# Patient Record
Sex: Male | Born: 1950 | Race: White | Hispanic: No | Marital: Married
Health system: Midwestern US, Academic
[De-identification: ages and names within clinical notes are randomized; demographics above are authoritative.]

---

## 2017-07-30 ENCOUNTER — Encounter: Admit: 2017-07-30 | Discharge: 2017-07-30 | Payer: MEDICARE

## 2017-07-30 DIAGNOSIS — I10 Essential (primary) hypertension: ICD-10-CM

## 2017-07-30 DIAGNOSIS — I2510 Atherosclerotic heart disease of native coronary artery without angina pectoris: ICD-10-CM

## 2017-07-30 DIAGNOSIS — E7800 Pure hypercholesterolemia, unspecified: Principal | ICD-10-CM

## 2017-08-02 ENCOUNTER — Encounter: Admit: 2017-08-02 | Discharge: 2017-08-02 | Payer: MEDICARE

## 2017-08-02 DIAGNOSIS — I10 Essential (primary) hypertension: ICD-10-CM

## 2017-08-02 DIAGNOSIS — I2510 Atherosclerotic heart disease of native coronary artery without angina pectoris: ICD-10-CM

## 2017-08-02 DIAGNOSIS — E7800 Pure hypercholesterolemia, unspecified: Principal | ICD-10-CM

## 2017-08-02 LAB — COMPREHENSIVE METABOLIC PANEL
Lab: 0.9
Lab: 6.9
Lab: 9.5
Lab: 103
Lab: 138
Lab: 17
Lab: 203 — ABNORMAL HIGH (ref 80–115)
Lab: 25
Lab: 3.7
Lab: 4

## 2017-08-02 LAB — LIPID PROFILE
Lab: 155
Lab: 63

## 2017-08-29 ENCOUNTER — Encounter: Admit: 2017-08-29 | Discharge: 2017-08-29 | Payer: MEDICARE

## 2017-08-29 ENCOUNTER — Ambulatory Visit: Admit: 2017-08-29 | Discharge: 2017-08-30 | Payer: MEDICARE

## 2017-08-29 DIAGNOSIS — I2510 Atherosclerotic heart disease of native coronary artery without angina pectoris: ICD-10-CM

## 2017-08-29 DIAGNOSIS — E785 Hyperlipidemia, unspecified: ICD-10-CM

## 2017-08-29 DIAGNOSIS — I10 Essential (primary) hypertension: ICD-10-CM

## 2017-08-29 DIAGNOSIS — I350 Nonrheumatic aortic (valve) stenosis: Principal | ICD-10-CM

## 2017-08-29 DIAGNOSIS — E119 Type 2 diabetes mellitus without complications: ICD-10-CM

## 2017-08-29 DIAGNOSIS — T148XXA Other injury of unspecified body region, initial encounter: ICD-10-CM

## 2017-08-29 NOTE — Assessment & Plan Note
Blood pressure looks good on the current medical program.

## 2017-08-29 NOTE — Assessment & Plan Note
His fasting glucose was about 200 and he has been having a lot of nocturia.  I strongly suggested that he make an appointment to get in to see Dr. Nyoka Cowden.

## 2017-08-29 NOTE — Assessment & Plan Note
Moderate aortic stenosis by echocardiography about a year ago.  No symptoms to suggest that he is progressed significantly.  I will plan an echocardiogram when I see him back in 1 year.

## 2017-08-29 NOTE — Progress Notes
Date of Service: 08/29/2017    Darius Cherry is a 66 y.o. male.       HPI     Darius Cherry was in the [place] office today for follow-up regarding coronary disease and aortic stenosis.  We reviewed the results of his recent lab work which included a fasting glucose level of about 200.  He has been having a lot of nocturia.  He has not seen Dr. Chilton Si for over a year.    He denies any problems with exertional chest discomfort.  He has a little bit of exertional breathlessness but the pattern is quite stable.  He denies any exertional lightheadedness.  He has had no palpitations or syncope.             Vitals:    08/29/17 1026 08/29/17 1034   BP: 116/76 120/74   Pulse: 66    Weight: 97.7 kg (215 lb 6.4 oz)    Height: 1.803 m (5' 11)      Body mass index is 30.04 kg/m???.     Past Medical History  Patient Active Problem List    Diagnosis Date Noted   ??? Left ankle pain 10/08/2016     Added automatically from request for surgery [id number]     ??? 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   ??? Essential hypertension 05/12/2015   ??? Nonrheumatic aortic valve stenosis 05/12/2015   ??? Type 2 diabetes mellitus without complication, without long-term current use of insulin (HCC) 05/12/2015   ??? Overweight (BMI 25.0-29.9) 11/11/2014   ??? Hyperlipemia 04/26/2009   ??? CAD (coronary artery disease) 04/26/2009     10/22/05: Per Dr. Ludwig Lean: Successful percutaneous coronary intervention of the circumflex obtuse marginal bifurcation with a 3.0 x 23 mm Vision bare metal stent extending from the circumflex into the obtuse marginal and a 3.5 x 12 mm Vision bare metal stent in the circumflex at the bifurcation utilizing a Kulot stent technique.     05/20/12: Drug-eluting stent PCI to prox CFX and LAD by Dr. Bufford Buttner           Review of Systems   Constitution: Negative.   HENT: Negative.    Eyes: Negative.    Cardiovascular: Negative.    Respiratory: Negative.    Endocrine: Negative.    Hematologic/Lymphatic: Negative.    Skin: Negative. Musculoskeletal: Negative.    Gastrointestinal: Negative.    Genitourinary: Negative.    Neurological: Negative.    Psychiatric/Behavioral: Negative.    Allergic/Immunologic: Negative.        Physical Exam    Physical Exam   General Appearance: no distress   Skin: warm, no ulcers or xanthomas   Digits and Nails: no cyanosis or clubbing   Eyes: conjunctivae and lids normal, pupils are equal and round   Teeth/Gums/Palate: dentition unremarkable, no lesions   Lips & Oral Mucosa: no pallor or cyanosis   Neck Veins: normal JVP , neck veins are not distended   Thyroid: no nodules, masses, tenderness or enlargement   Chest Inspection: chest is normal in appearance   Respiratory Effort: breathing comfortably, no respiratory distress   Auscultation/Percussion: lungs clear to auscultation, no rales or rhonchi, no wheezing   PMI: PMI not enlarged or displaced   Cardiac Rhythm: regular rhythm and normal rate   Cardiac Auscultation: S1, S2 normal, no rub, no gallop   Murmurs: grade 2 systolic murmur   Peripheral Circulation: normal peripheral circulation   Carotid Arteries: normal carotid upstroke bilaterally, no bruits   Radial  Arteries: normal symmetric radial pulses   Abdominal Aorta: no abdominal aortic bruit   Pedal Pulses: normal symmetric pedal pulses   Lower Extremity Edema: no lower extremity edema   Abdominal Exam: soft, non-tender, no masses, bowel sounds normal   Liver & Spleen: no organomegaly   Gait & Station: walks without assistance   Muscle Strength: normal muscle tone   Orientation: oriented to time, place and person   Affect & Mood: appropriate and sustained affect   Language and Memory: patient responsive and seems to comprehend information   Neurologic Exam: neurological assessment grossly intact   Other: moves all extremities        Problems Addressed Today  Encounter Diagnoses   Name Primary?   ??? Nonrheumatic aortic valve stenosis    ??? Essential hypertension ??? Coronary artery disease involving native coronary artery of native heart without angina pectoris    ??? Type 2 diabetes mellitus without complication, without long-term current use of insulin (HCC)        Assessment and Plan       Nonrheumatic aortic valve stenosis  Moderate aortic stenosis by echocardiography about a year ago.  No symptoms to suggest that he is progressed significantly.  I will plan an echocardiogram when I see him back in 1 year.    Essential hypertension  Blood pressure looks good on the current medical program.    CAD (coronary artery disease)  It has been 5 years since his coronary stent procedure.  I will schedule a stress test when I see him back in about a year.  He is not having any angina symptoms currently.    Type 2 diabetes mellitus without complication, without long-term current use of insulin (HCC)  His fasting glucose was about 200 and he has been having a lot of nocturia.  I strongly suggested that he make an appointment to get in to see Dr. Chilton Si.      Current Medications (including today's revisions)  ??? Aspirin 81 mg Tab Take 1 Tab by mouth daily.   ??? atorvastatin (LIPITOR) 40 mg tablet TAKE ONE TABLET BY MOUTH ONCE DAILY   ??? CALCIUM CITRATE/VITAMIN D3 (CALCIUM CITRATE + D PO) Take  by mouth daily.   ??? HYDROcodone/acetaminophen (NORCO) 5/325 mg tablet Take 1-2 tablets by mouth every 6 hours as needed for Pain Max 8 tabs/day   ??? metoprolol (LOPRESSOR) 25 mg tablet Take 0.5 Tabs by mouth twice daily. The dose is 12.5 mg (half tablet) po BID.   ??? nitroglycerin (NITROSTAT) 0.4 mg tablet Take one sublingual nitro Q 5 minX three for chest pain   ??? Sitagliptin-Metformin (JANUMET) 50-1,000 mg Tab Take 1 Tab by mouth twice daily with meals. DO NOT RESUME UNTIL 05/23/12 (Patient taking differently: Take 2 tablets by mouth twice daily with meals. DO NOT RESUME UNTIL 05/23/12)

## 2017-08-29 NOTE — Assessment & Plan Note
It has been 5 years since his coronary stent procedure.  I will schedule a stress test when I see him back in about a year.  He is not having any angina symptoms currently.

## 2017-09-26 ENCOUNTER — Encounter: Admit: 2017-09-26 | Discharge: 2017-09-26 | Payer: MEDICARE

## 2017-09-27 MED ORDER — ATORVASTATIN 40 MG PO TAB
ORAL_TABLET | Freq: Every day | 3 refills | Status: AC
Start: 2017-09-27 — End: 2018-11-25

## 2017-12-06 ENCOUNTER — Encounter: Admit: 2017-12-06 | Discharge: 2017-12-06 | Payer: MEDICARE

## 2017-12-06 DIAGNOSIS — M25572 Pain in left ankle and joints of left foot: Principal | ICD-10-CM

## 2017-12-10 ENCOUNTER — Ambulatory Visit: Admit: 2017-12-10 | Discharge: 2017-12-10 | Payer: MEDICARE

## 2017-12-10 ENCOUNTER — Encounter: Admit: 2017-12-10 | Discharge: 2017-12-11 | Payer: MEDICARE

## 2017-12-10 ENCOUNTER — Encounter: Admit: 2017-12-10 | Discharge: 2017-12-10 | Payer: MEDICARE

## 2017-12-10 DIAGNOSIS — I2510 Atherosclerotic heart disease of native coronary artery without angina pectoris: Principal | ICD-10-CM

## 2017-12-10 DIAGNOSIS — T148XXA Other injury of unspecified body region, initial encounter: ICD-10-CM

## 2017-12-10 DIAGNOSIS — M25472 Effusion, left ankle: ICD-10-CM

## 2017-12-10 DIAGNOSIS — M25572 Pain in left ankle and joints of left foot: Principal | ICD-10-CM

## 2017-12-10 DIAGNOSIS — E119 Type 2 diabetes mellitus without complications: ICD-10-CM

## 2017-12-10 DIAGNOSIS — I10 Essential (primary) hypertension: ICD-10-CM

## 2017-12-10 DIAGNOSIS — E785 Hyperlipidemia, unspecified: ICD-10-CM

## 2017-12-10 LAB — GRAM STAIN

## 2017-12-10 MED ORDER — AMOXICILLIN 500 MG PO TAB
ORAL_TABLET | ORAL | 1 refills | 7.00000 days | Status: AC
Start: 2017-12-10 — End: ?

## 2017-12-15 LAB — CULTURE-WOUND/TISSUE/FLUID(AEROBIC ONLY)W/SENSITIVITY

## 2017-12-16 LAB — CULTURE-ANAEROBIC

## 2017-12-18 ENCOUNTER — Encounter: Admit: 2017-12-18 | Discharge: 2017-12-18 | Payer: MEDICARE

## 2017-12-18 DIAGNOSIS — M25472 Effusion, left ankle: Principal | ICD-10-CM

## 2017-12-18 DIAGNOSIS — M25572 Pain in left ankle and joints of left foot: ICD-10-CM

## 2017-12-19 ENCOUNTER — Encounter: Admit: 2017-12-19 | Discharge: 2017-12-19 | Payer: MEDICARE

## 2017-12-19 DIAGNOSIS — T148XXA Other injury of unspecified body region, initial encounter: ICD-10-CM

## 2017-12-19 DIAGNOSIS — E119 Type 2 diabetes mellitus without complications: ICD-10-CM

## 2017-12-19 DIAGNOSIS — I2510 Atherosclerotic heart disease of native coronary artery without angina pectoris: ICD-10-CM

## 2017-12-19 DIAGNOSIS — E785 Hyperlipidemia, unspecified: ICD-10-CM

## 2017-12-19 DIAGNOSIS — I10 Essential (primary) hypertension: ICD-10-CM

## 2017-12-30 ENCOUNTER — Encounter: Admit: 2017-12-30 | Discharge: 2017-12-30 | Payer: MEDICARE

## 2018-01-13 LAB — CULTURE-FUNGAL,OTHER

## 2018-01-21 ENCOUNTER — Ambulatory Visit: Admit: 2018-01-21 | Discharge: 2018-01-21 | Payer: MEDICARE

## 2018-01-21 ENCOUNTER — Encounter: Admit: 2018-01-21 | Discharge: 2018-01-21 | Payer: MEDICARE

## 2018-01-21 DIAGNOSIS — E119 Type 2 diabetes mellitus without complications: ICD-10-CM

## 2018-01-21 DIAGNOSIS — I2510 Atherosclerotic heart disease of native coronary artery without angina pectoris: Principal | ICD-10-CM

## 2018-01-21 DIAGNOSIS — M25572 Pain in left ankle and joints of left foot: ICD-10-CM

## 2018-01-21 DIAGNOSIS — M25472 Effusion, left ankle: Principal | ICD-10-CM

## 2018-01-21 DIAGNOSIS — T148XXA Other injury of unspecified body region, initial encounter: ICD-10-CM

## 2018-01-21 DIAGNOSIS — G8929 Other chronic pain: ICD-10-CM

## 2018-01-21 DIAGNOSIS — I10 Essential (primary) hypertension: ICD-10-CM

## 2018-01-21 DIAGNOSIS — T84098A Other mechanical complication of other internal joint prosthesis, initial encounter: Principal | ICD-10-CM

## 2018-01-21 DIAGNOSIS — E785 Hyperlipidemia, unspecified: ICD-10-CM

## 2018-01-27 LAB — CULTURE-TB (AFB)

## 2018-01-30 ENCOUNTER — Encounter: Admit: 2018-01-30 | Discharge: 2018-01-30 | Payer: MEDICARE

## 2018-01-30 DIAGNOSIS — T84098A Other mechanical complication of other internal joint prosthesis, initial encounter: Principal | ICD-10-CM

## 2018-02-05 DIAGNOSIS — T84098A Other mechanical complication of other internal joint prosthesis, initial encounter: ICD-10-CM

## 2018-03-10 ENCOUNTER — Ambulatory Visit: Admit: 2018-03-10 | Discharge: 2018-03-11 | Payer: MEDICARE

## 2018-03-10 ENCOUNTER — Encounter: Admit: 2018-03-10 | Discharge: 2018-03-10 | Payer: MEDICARE

## 2018-03-10 ENCOUNTER — Inpatient Hospital Stay: Admit: 2018-03-10 | Discharge: 2018-03-10 | Payer: MEDICARE

## 2018-03-10 DIAGNOSIS — I10 Essential (primary) hypertension: ICD-10-CM

## 2018-03-10 DIAGNOSIS — T148XXA Other injury of unspecified body region, initial encounter: ICD-10-CM

## 2018-03-10 DIAGNOSIS — E119 Type 2 diabetes mellitus without complications: ICD-10-CM

## 2018-03-10 DIAGNOSIS — I350 Nonrheumatic aortic (valve) stenosis: ICD-10-CM

## 2018-03-10 DIAGNOSIS — I2510 Atherosclerotic heart disease of native coronary artery without angina pectoris: ICD-10-CM

## 2018-03-10 DIAGNOSIS — Z955 Presence of coronary angioplasty implant and graft: ICD-10-CM

## 2018-03-10 DIAGNOSIS — E785 Hyperlipidemia, unspecified: Principal | ICD-10-CM

## 2018-03-10 DIAGNOSIS — I219 Acute myocardial infarction, unspecified: ICD-10-CM

## 2018-03-10 DIAGNOSIS — R011 Cardiac murmur, unspecified: ICD-10-CM

## 2018-03-10 DIAGNOSIS — C4490 Unspecified malignant neoplasm of skin, unspecified: ICD-10-CM

## 2018-03-10 LAB — BASIC METABOLIC PANEL
Lab: 0.9 mg/dL (ref 0.4–1.24)
Lab: 103 MMOL/L (ref 98–110)
Lab: 137 MMOL/L (ref 137–147)
Lab: 201 mg/dL — ABNORMAL HIGH (ref 70–100)
Lab: 3.8 MMOL/L (ref 3.5–5.1)
Lab: 9 g/dL (ref 3–12)
Lab: >60 mL/min (ref >60)
Lab: >60 mL/min (ref >60)

## 2018-03-10 LAB — CBC
Lab: 4.5 M/UL (ref 4.4–5.5)
Lab: 5.6 10*3/uL (ref 4.5–11.0)

## 2018-03-11 DIAGNOSIS — I25119 Atherosclerotic heart disease of native coronary artery with unspecified angina pectoris: Principal | ICD-10-CM

## 2018-03-11 DIAGNOSIS — Z01818 Encounter for other preprocedural examination: ICD-10-CM

## 2018-03-28 ENCOUNTER — Encounter: Admit: 2018-03-28 | Discharge: 2018-03-29 | Payer: MEDICARE

## 2018-03-28 ENCOUNTER — Encounter: Admit: 2018-03-28 | Discharge: 2018-03-28 | Payer: MEDICARE

## 2018-03-28 ENCOUNTER — Inpatient Hospital Stay: Admit: 2018-03-28 | Discharge: 2018-03-30 | Disposition: A | Discharge status: Home / Self Care | Payer: MEDICARE

## 2018-03-28 DIAGNOSIS — E785 Hyperlipidemia, unspecified: Principal | ICD-10-CM

## 2018-03-28 DIAGNOSIS — I2510 Atherosclerotic heart disease of native coronary artery without angina pectoris: ICD-10-CM

## 2018-03-28 DIAGNOSIS — R011 Cardiac murmur, unspecified: ICD-10-CM

## 2018-03-28 DIAGNOSIS — Z955 Presence of coronary angioplasty implant and graft: ICD-10-CM

## 2018-03-28 DIAGNOSIS — T148XXA Other injury of unspecified body region, initial encounter: ICD-10-CM

## 2018-03-28 DIAGNOSIS — I10 Essential (primary) hypertension: ICD-10-CM

## 2018-03-28 DIAGNOSIS — C4490 Unspecified malignant neoplasm of skin, unspecified: ICD-10-CM

## 2018-03-28 DIAGNOSIS — I350 Nonrheumatic aortic (valve) stenosis: ICD-10-CM

## 2018-03-28 DIAGNOSIS — I219 Acute myocardial infarction, unspecified: ICD-10-CM

## 2018-03-28 DIAGNOSIS — E119 Type 2 diabetes mellitus without complications: ICD-10-CM

## 2018-03-28 LAB — GRAM STAIN

## 2018-03-28 LAB — POC GLUCOSE
Lab: 147 mg/dL — ABNORMAL HIGH (ref 70–100)
Lab: 179 mg/dL — ABNORMAL HIGH (ref 70–100)

## 2018-03-28 MED ORDER — ONDANSETRON HCL (PF) 4 MG/2 ML IJ SOLN
INTRAVENOUS | 0 refills | Status: DC
Start: 2018-03-28 — End: 2018-03-28
  Administered 2018-03-28: 15:00:00 4 mg via INTRAVENOUS

## 2018-03-28 MED ORDER — OXYCODONE 5 MG PO TAB
5-10 mg | Freq: Once | ORAL | 0 refills | Status: DC | PRN
Start: 2018-03-28 — End: 2018-03-28

## 2018-03-28 MED ORDER — ACETAMINOPHEN 325 MG PO TAB
650 mg | ORAL | 0 refills | Status: DC | PRN
Start: 2018-03-28 — End: 2018-03-30
  Administered 2018-03-28: 22:00:00 650 mg via ORAL

## 2018-03-28 MED ORDER — DOCUSATE SODIUM 100 MG PO CAP
100 mg | Freq: Two times a day (BID) | ORAL | 0 refills | Status: DC
Start: 2018-03-28 — End: 2018-03-30
  Administered 2018-03-28 – 2018-03-29 (×2): 100 mg via ORAL

## 2018-03-28 MED ORDER — DIPHENHYDRAMINE HCL 50 MG/ML IJ SOLN
25 mg | Freq: Once | INTRAVENOUS | 0 refills | Status: DC | PRN
Start: 2018-03-28 — End: 2018-03-28

## 2018-03-28 MED ORDER — THROMBIN (BOVINE) 5,000 UNIT TP SOLR
0 refills | Status: DC
Start: 2018-03-28 — End: 2018-03-28
  Administered 2018-03-28: 16:00:00 5000 [IU] via TOPICAL

## 2018-03-28 MED ORDER — ACETAMINOPHEN 650 MG RE SUPP
650 mg | RECTAL | 0 refills | Status: DC | PRN
Start: 2018-03-28 — End: 2018-03-30

## 2018-03-28 MED ORDER — DEXTRAN 70-HYPROMELLOSE (PF) 0.1-0.3 % OP DPET
0 refills | Status: DC
Start: 2018-03-28 — End: 2018-03-28
  Administered 2018-03-28: 13:00:00 2 [drp] via OPHTHALMIC

## 2018-03-28 MED ORDER — CEFAZOLIN INJ 1GM IVP
2 g | INTRAVENOUS | 0 refills | Status: CP
Start: 2018-03-28 — End: ?
  Administered 2018-03-28 – 2018-03-29 (×2): 2 g via INTRAVENOUS

## 2018-03-28 MED ORDER — MEPERIDINE (PF) 25 MG/ML IJ SYRG
12.5 mg | INTRAVENOUS | 0 refills | Status: DC | PRN
Start: 2018-03-28 — End: 2018-03-28

## 2018-03-28 MED ORDER — GABAPENTIN 300 MG PO CAP
300 mg | Freq: Three times a day (TID) | ORAL | 0 refills | Status: DC
Start: 2018-03-28 — End: 2018-03-30
  Administered 2018-03-28 – 2018-03-29 (×3): 300 mg via ORAL

## 2018-03-28 MED ORDER — LIDOCAINE (PF) 200 MG/10 ML (2 %) IJ SYRG
0 refills | Status: DC
Start: 2018-03-28 — End: 2018-03-28
  Administered 2018-03-28: 13:00:00 80 mg via INTRAVENOUS

## 2018-03-28 MED ORDER — DIPHENHYDRAMINE HCL 50 MG/ML IJ SOLN
25 mg | INTRAVENOUS | 0 refills | Status: DC | PRN
Start: 2018-03-28 — End: 2018-03-30

## 2018-03-28 MED ORDER — LACTATED RINGERS IV SOLP
1000 mL | INTRAVENOUS | 0 refills | Status: DC
Start: 2018-03-28 — End: 2018-03-30
  Administered 2018-03-28: 12:00:00 1000 mL via INTRAVENOUS
  Administered 2018-03-28: 16:00:00 1000.000 mL via INTRAVENOUS

## 2018-03-28 MED ORDER — FENTANYL CITRATE (PF) 50 MCG/ML IJ SOLN
25 ug | INTRAVENOUS | 0 refills | Status: DC | PRN
Start: 2018-03-28 — End: 2018-03-28

## 2018-03-28 MED ORDER — PROPOFOL INJ 10 MG/ML IV VIAL
0 refills | Status: DC
Start: 2018-03-28 — End: 2018-03-28
  Administered 2018-03-28: 13:00:00 120 mg via INTRAVENOUS

## 2018-03-28 MED ORDER — DIPHENHYDRAMINE HCL 25 MG PO CAP
25 mg | ORAL | 0 refills | Status: DC | PRN
Start: 2018-03-28 — End: 2018-03-30

## 2018-03-28 MED ORDER — DEXAMETHASONE SODIUM PHOS (PF) 10 MG/ML IJ SOLN
4 mg | Freq: Once | INTRAVENOUS | 0 refills | Status: DC | PRN
Start: 2018-03-28 — End: 2018-03-28

## 2018-03-28 MED ORDER — FENTANYL CITRATE (PF) 50 MCG/ML IJ SOLN
50 ug | INTRAVENOUS | 0 refills | Status: DC | PRN
Start: 2018-03-28 — End: 2018-03-28

## 2018-03-28 MED ORDER — CEFAZOLIN 1 GRAM IJ SOLR
0 refills | Status: DC
Start: 2018-03-28 — End: 2018-03-28
  Administered 2018-03-28: 16:00:00 1 g via INTRAVENOUS

## 2018-03-28 MED ORDER — MAGNESIUM HYDROXIDE 2,400 MG/10 ML PO SUSP
10 mL | Freq: Every day | ORAL | 0 refills | Status: DC
Start: 2018-03-28 — End: 2018-03-30
  Administered 2018-03-29: 01:00:00 10 mL via ORAL

## 2018-03-28 MED ORDER — LIDOCAINE (PF) 10 MG/ML (1 %) IJ SOLN
.1-2 mL | INTRAMUSCULAR | 0 refills | Status: DC | PRN
Start: 2018-03-28 — End: 2018-03-28

## 2018-03-28 MED ORDER — GENTAMICIN 40 MG/ML IJ SOLN
0 refills | Status: DC
Start: 2018-03-28 — End: 2018-03-28
  Administered 2018-03-28: 15:00:00 2 mL via TOPICAL

## 2018-03-28 MED ORDER — BUPIVACAINE 0.125% PCA PNC SYR
PERINEURAL | 0 refills | Status: DC
Start: 2018-03-28 — End: 2018-03-30
  Administered 2018-03-28 – 2018-03-29 (×6): 50.000 mL via PERINEURAL

## 2018-03-28 MED ORDER — ROCURONIUM 10 MG/ML IV SOLN
INTRAVENOUS | 0 refills | Status: DC
Start: 2018-03-28 — End: 2018-03-28
  Administered 2018-03-28: 13:00:00 30 mg via INTRAVENOUS

## 2018-03-28 MED ORDER — FENTANYL CITRATE (PF) 50 MCG/ML IJ SOLN
25 ug | INTRAVENOUS | 0 refills | Status: DC | PRN
Start: 2018-03-28 — End: 2018-03-30

## 2018-03-28 MED ORDER — PHENYLEPHRINE IN 0.9% NACL(PF) 1 MG/10 ML (100 MCG/ML) IV SYRG
INTRAVENOUS | 0 refills | Status: DC
Start: 2018-03-28 — End: 2018-03-28
  Administered 2018-03-28 (×3): 100 ug via INTRAVENOUS
  Administered 2018-03-28: 13:00:00 200 ug via INTRAVENOUS
  Administered 2018-03-28: 13:00:00 100 ug via INTRAVENOUS

## 2018-03-28 MED ORDER — HYDROMORPHONE (PF) 2 MG/ML IJ SYRG
.5-1 mg | INTRAVENOUS | 0 refills | Status: DC | PRN
Start: 2018-03-28 — End: 2018-03-28

## 2018-03-28 MED ORDER — GABAPENTIN 300 MG PO CAP
300 mg | Freq: Once | ORAL | 0 refills | Status: CP
Start: 2018-03-28 — End: ?
  Administered 2018-03-28: 12:00:00 300 mg via ORAL

## 2018-03-28 MED ORDER — FENTANYL CITRATE (PF) 50 MCG/ML IJ SOLN
0 refills | Status: DC
Start: 2018-03-28 — End: 2018-03-28
  Administered 2018-03-28: 12:00:00 50 ug via INTRAVENOUS
  Administered 2018-03-28: 15:00:00 25 ug via INTRAVENOUS

## 2018-03-28 MED ORDER — OXYCODONE 5 MG PO TAB
5-10 mg | Freq: Once | ORAL | 0 refills | Status: CP | PRN
Start: 2018-03-28 — End: ?
  Administered 2018-03-28: 18:00:00 10 mg via ORAL

## 2018-03-28 MED ORDER — OXYCODONE 5 MG PO TAB
5-10 mg | ORAL | 0 refills | Status: DC | PRN
Start: 2018-03-28 — End: 2018-03-30
  Administered 2018-03-28: 22:00:00 10 mg via ORAL

## 2018-03-28 MED ORDER — PROMETHAZINE 25 MG/ML IJ SOLN
6.25 mg | INTRAVENOUS | 0 refills | Status: DC | PRN
Start: 2018-03-28 — End: 2018-03-28

## 2018-03-28 MED ORDER — ENOXAPARIN 40 MG/0.4 ML SC SYRG
40 mg | Freq: Every day | SUBCUTANEOUS | 0 refills | Status: DC
Start: 2018-03-28 — End: 2018-03-30
  Administered 2018-03-29: 01:00:00 40 mg via SUBCUTANEOUS

## 2018-03-28 MED ORDER — ROPIVACAINE (PF) 5 MG/ML (0.5 %) IJ SOLN
0 refills | Status: DC
  Administered 2018-03-28: 12:00:00 20 mL
  Administered 2018-03-28: 12:00:00 10 mL

## 2018-03-28 MED ORDER — ACETAMINOPHEN 500 MG PO TAB
1000 mg | Freq: Once | ORAL | 0 refills | Status: CP
Start: 2018-03-28 — End: ?
  Administered 2018-03-28: 12:00:00 1000 mg via ORAL

## 2018-03-28 MED ORDER — ACETAMINOPHEN 325 MG PO TAB
650 mg | ORAL | 0 refills | Status: DC
Start: 2018-03-28 — End: 2018-03-30
  Administered 2018-03-29 (×3): 650 mg via ORAL

## 2018-03-28 MED ORDER — HALOPERIDOL LACTATE 5 MG/ML IJ SOLN
1 mg | Freq: Once | INTRAVENOUS | 0 refills | Status: DC | PRN
Start: 2018-03-28 — End: 2018-03-28

## 2018-03-28 MED ORDER — MIDAZOLAM 1 MG/ML IJ SOLN
INTRAVENOUS | 0 refills | Status: DC
Start: 2018-03-28 — End: 2018-03-28
  Administered 2018-03-28: 12:00:00 1 mg via INTRAVENOUS

## 2018-03-28 MED ORDER — PHENYLEPHRINE IV DRIP (STD CONC)
0 refills | Status: DC
Start: 2018-03-28 — End: 2018-03-28
  Administered 2018-03-28 (×2): .5 ug/kg/min via INTRAVENOUS

## 2018-03-28 MED ORDER — DEXAMETHASONE SODIUM PHOSPHATE 4 MG/ML IJ SOLN
INTRAVENOUS | 0 refills | Status: DC
Start: 2018-03-28 — End: 2018-03-28
  Administered 2018-03-28: 13:00:00 4 mg via INTRAVENOUS

## 2018-03-28 MED ORDER — ONDANSETRON HCL (PF) 4 MG/2 ML IJ SOLN
4 mg | INTRAVENOUS | 0 refills | Status: DC | PRN
Start: 2018-03-28 — End: 2018-03-30

## 2018-03-28 MED ORDER — BISACODYL 10 MG RE SUPP
10 mg | Freq: Every day | RECTAL | 0 refills | Status: DC | PRN
Start: 2018-03-28 — End: 2018-03-30

## 2018-03-28 MED ORDER — ANTICOAG SODIUM CITRATE SOLN 5ML
0 refills | Status: DC
Start: 2018-03-28 — End: 2018-03-28
  Administered 2018-03-28: 16:00:00 5 mL via TOPICAL

## 2018-03-29 ENCOUNTER — Encounter: Admit: 2018-03-29 | Discharge: 2018-03-29 | Payer: MEDICARE

## 2018-03-29 LAB — BASIC METABOLIC PANEL: Lab: 135 MMOL/L — ABNORMAL LOW (ref 137–147)

## 2018-03-29 LAB — CBC: Lab: 8.9 K/UL (ref 4.5–11.0)

## 2018-03-29 MED ORDER — OXYCODONE 5 MG PO TAB
5-10 mg | ORAL_TABLET | ORAL | 0 refills | 6.00000 days | Status: AC | PRN
Start: 2018-03-29 — End: 2018-08-21
  Filled 2018-03-29 (×2): qty 60, 5d supply, fill #1

## 2018-03-29 MED ORDER — ROPIVACAINE 0.2% INFUSION (ON-Q PUMP CB004/P400X2-14)
PERINEURAL | 0 refills | Status: DC
Start: 2018-03-29 — End: 2018-03-30
  Administered 2018-03-29: 18:00:00 400.000 mL via PERINEURAL

## 2018-03-29 MED ORDER — DOCUSATE SODIUM 100 MG PO CAP
100 mg | ORAL_CAPSULE | Freq: Two times a day (BID) | ORAL | 1 refills | Status: AC
Start: 2018-03-29 — End: 2018-08-21

## 2018-03-29 MED ORDER — ACETAMINOPHEN 325 MG PO TAB
650 mg | ORAL | 0 refills | Status: AC | PRN
Start: 2018-03-29 — End: ?

## 2018-03-30 ENCOUNTER — Ambulatory Visit: Admit: 2018-03-28 | Discharge: 2018-03-28 | Payer: MEDICARE

## 2018-03-30 ENCOUNTER — Inpatient Hospital Stay: Admit: 2018-03-28 | Discharge: 2018-03-28 | Payer: MEDICARE

## 2018-03-30 DIAGNOSIS — E119 Type 2 diabetes mellitus without complications: ICD-10-CM

## 2018-03-30 DIAGNOSIS — E785 Hyperlipidemia, unspecified: ICD-10-CM

## 2018-03-30 DIAGNOSIS — Z955 Presence of coronary angioplasty implant and graft: ICD-10-CM

## 2018-03-30 DIAGNOSIS — Z85828 Personal history of other malignant neoplasm of skin: ICD-10-CM

## 2018-03-30 DIAGNOSIS — I350 Nonrheumatic aortic (valve) stenosis: ICD-10-CM

## 2018-03-30 DIAGNOSIS — T84127A Displacement of internal fixation device of bone of left lower leg, initial encounter: Principal | ICD-10-CM

## 2018-03-30 DIAGNOSIS — I252 Old myocardial infarction: ICD-10-CM

## 2018-03-30 DIAGNOSIS — I2510 Atherosclerotic heart disease of native coronary artery without angina pectoris: ICD-10-CM

## 2018-03-30 DIAGNOSIS — I10 Essential (primary) hypertension: ICD-10-CM

## 2018-03-31 ENCOUNTER — Encounter: Admit: 2018-03-31 | Discharge: 2018-03-31 | Payer: MEDICARE

## 2018-03-31 DIAGNOSIS — R011 Cardiac murmur, unspecified: ICD-10-CM

## 2018-03-31 DIAGNOSIS — I219 Acute myocardial infarction, unspecified: ICD-10-CM

## 2018-03-31 DIAGNOSIS — C4490 Unspecified malignant neoplasm of skin, unspecified: ICD-10-CM

## 2018-03-31 DIAGNOSIS — I10 Essential (primary) hypertension: ICD-10-CM

## 2018-03-31 DIAGNOSIS — E119 Type 2 diabetes mellitus without complications: ICD-10-CM

## 2018-03-31 DIAGNOSIS — T148XXA Other injury of unspecified body region, initial encounter: ICD-10-CM

## 2018-03-31 DIAGNOSIS — E785 Hyperlipidemia, unspecified: Principal | ICD-10-CM

## 2018-03-31 DIAGNOSIS — I2510 Atherosclerotic heart disease of native coronary artery without angina pectoris: ICD-10-CM

## 2018-03-31 DIAGNOSIS — I350 Nonrheumatic aortic (valve) stenosis: ICD-10-CM

## 2018-03-31 DIAGNOSIS — Z955 Presence of coronary angioplasty implant and graft: ICD-10-CM

## 2018-04-01 ENCOUNTER — Encounter: Admit: 2018-04-01 | Discharge: 2018-04-01 | Payer: MEDICARE

## 2018-04-02 LAB — CULTURE-ANAEROBIC

## 2018-04-02 LAB — CULTURE-WOUND/TISSUE/FLUID(AEROBIC ONLY)W/SENSITIVITY

## 2018-04-03 ENCOUNTER — Ambulatory Visit: Admit: 2018-04-03 | Discharge: 2018-04-04 | Payer: MEDICARE

## 2018-04-03 ENCOUNTER — Encounter: Admit: 2018-04-03 | Discharge: 2018-04-03 | Payer: MEDICARE

## 2018-04-03 DIAGNOSIS — Z955 Presence of coronary angioplasty implant and graft: ICD-10-CM

## 2018-04-03 DIAGNOSIS — I219 Acute myocardial infarction, unspecified: ICD-10-CM

## 2018-04-03 DIAGNOSIS — C4490 Unspecified malignant neoplasm of skin, unspecified: ICD-10-CM

## 2018-04-03 DIAGNOSIS — T148XXA Other injury of unspecified body region, initial encounter: ICD-10-CM

## 2018-04-03 DIAGNOSIS — R011 Cardiac murmur, unspecified: ICD-10-CM

## 2018-04-03 DIAGNOSIS — E119 Type 2 diabetes mellitus without complications: ICD-10-CM

## 2018-04-03 DIAGNOSIS — I2510 Atherosclerotic heart disease of native coronary artery without angina pectoris: ICD-10-CM

## 2018-04-03 DIAGNOSIS — I350 Nonrheumatic aortic (valve) stenosis: ICD-10-CM

## 2018-04-03 DIAGNOSIS — E785 Hyperlipidemia, unspecified: Principal | ICD-10-CM

## 2018-04-03 DIAGNOSIS — I10 Essential (primary) hypertension: ICD-10-CM

## 2018-04-04 DIAGNOSIS — Z96662 Presence of left artificial ankle joint: ICD-10-CM

## 2018-04-04 DIAGNOSIS — T84098A Other mechanical complication of other internal joint prosthesis, initial encounter: Principal | ICD-10-CM

## 2018-04-04 DIAGNOSIS — Z96669 Presence of unspecified artificial ankle joint: ICD-10-CM

## 2018-04-15 ENCOUNTER — Ambulatory Visit: Admit: 2018-04-15 | Discharge: 2018-04-15 | Payer: MEDICARE

## 2018-04-15 DIAGNOSIS — M25572 Pain in left ankle and joints of left foot: Principal | ICD-10-CM

## 2018-04-28 LAB — CULTURE-FUNGAL,OTHER

## 2018-05-12 ENCOUNTER — Encounter: Admit: 2018-05-12 | Discharge: 2018-05-12 | Payer: MEDICARE

## 2018-05-12 DIAGNOSIS — M25572 Pain in left ankle and joints of left foot: Principal | ICD-10-CM

## 2018-05-15 ENCOUNTER — Ambulatory Visit: Admit: 2018-05-15 | Discharge: 2018-05-15 | Payer: MEDICARE

## 2018-05-15 ENCOUNTER — Encounter: Admit: 2018-05-15 | Discharge: 2018-05-15 | Payer: MEDICARE

## 2018-05-15 DIAGNOSIS — G8929 Other chronic pain: ICD-10-CM

## 2018-05-15 DIAGNOSIS — I219 Acute myocardial infarction, unspecified: ICD-10-CM

## 2018-05-15 DIAGNOSIS — M25572 Pain in left ankle and joints of left foot: Principal | ICD-10-CM

## 2018-05-15 DIAGNOSIS — R011 Cardiac murmur, unspecified: ICD-10-CM

## 2018-05-15 DIAGNOSIS — C4490 Unspecified malignant neoplasm of skin, unspecified: ICD-10-CM

## 2018-05-15 DIAGNOSIS — E785 Hyperlipidemia, unspecified: Principal | ICD-10-CM

## 2018-05-15 DIAGNOSIS — Z955 Presence of coronary angioplasty implant and graft: ICD-10-CM

## 2018-05-15 DIAGNOSIS — I2510 Atherosclerotic heart disease of native coronary artery without angina pectoris: ICD-10-CM

## 2018-05-15 DIAGNOSIS — I350 Nonrheumatic aortic (valve) stenosis: ICD-10-CM

## 2018-05-15 DIAGNOSIS — I10 Essential (primary) hypertension: ICD-10-CM

## 2018-05-15 DIAGNOSIS — E119 Type 2 diabetes mellitus without complications: ICD-10-CM

## 2018-05-15 DIAGNOSIS — T148XXA Other injury of unspecified body region, initial encounter: ICD-10-CM

## 2018-05-19 LAB — CULTURE-TB (AFB)

## 2018-07-04 ENCOUNTER — Encounter: Admit: 2018-07-04 | Discharge: 2018-07-04 | Payer: MEDICARE

## 2018-07-22 ENCOUNTER — Encounter: Admit: 2018-07-22 | Discharge: 2018-07-22 | Payer: MEDICARE

## 2018-07-22 DIAGNOSIS — I350 Nonrheumatic aortic (valve) stenosis: ICD-10-CM

## 2018-07-22 DIAGNOSIS — I10 Essential (primary) hypertension: Principal | ICD-10-CM

## 2018-07-22 DIAGNOSIS — I2510 Atherosclerotic heart disease of native coronary artery without angina pectoris: ICD-10-CM

## 2018-07-23 ENCOUNTER — Encounter: Admit: 2018-07-23 | Discharge: 2018-07-23 | Payer: MEDICARE

## 2018-08-07 ENCOUNTER — Ambulatory Visit: Admit: 2018-08-07 | Discharge: 2018-08-07 | Payer: MEDICARE

## 2018-08-07 ENCOUNTER — Encounter: Admit: 2018-08-07 | Discharge: 2018-08-07 | Payer: MEDICARE

## 2018-08-07 ENCOUNTER — Ambulatory Visit: Admit: 2018-08-07 | Discharge: 2018-08-08 | Payer: MEDICARE

## 2018-08-07 DIAGNOSIS — I10 Essential (primary) hypertension: ICD-10-CM

## 2018-08-07 DIAGNOSIS — I350 Nonrheumatic aortic (valve) stenosis: ICD-10-CM

## 2018-08-07 DIAGNOSIS — I2510 Atherosclerotic heart disease of native coronary artery without angina pectoris: Principal | ICD-10-CM

## 2018-08-12 ENCOUNTER — Encounter: Admit: 2018-08-12 | Discharge: 2018-08-12 | Payer: MEDICARE

## 2018-08-12 DIAGNOSIS — I10 Essential (primary) hypertension: ICD-10-CM

## 2018-08-12 DIAGNOSIS — I350 Nonrheumatic aortic (valve) stenosis: ICD-10-CM

## 2018-08-12 DIAGNOSIS — I2510 Atherosclerotic heart disease of native coronary artery without angina pectoris: Principal | ICD-10-CM

## 2018-08-13 ENCOUNTER — Encounter: Admit: 2018-08-13 | Discharge: 2018-08-13 | Payer: MEDICARE

## 2018-08-21 ENCOUNTER — Ambulatory Visit: Admit: 2018-08-21 | Discharge: 2018-08-22 | Payer: MEDICARE

## 2018-08-21 ENCOUNTER — Encounter: Admit: 2018-08-21 | Discharge: 2018-08-21 | Payer: MEDICARE

## 2018-08-21 DIAGNOSIS — T148XXA Other injury of unspecified body region, initial encounter: ICD-10-CM

## 2018-08-21 DIAGNOSIS — I350 Nonrheumatic aortic (valve) stenosis: Principal | ICD-10-CM

## 2018-08-21 DIAGNOSIS — R011 Cardiac murmur, unspecified: ICD-10-CM

## 2018-08-21 DIAGNOSIS — C4490 Unspecified malignant neoplasm of skin, unspecified: ICD-10-CM

## 2018-08-21 DIAGNOSIS — E785 Hyperlipidemia, unspecified: Principal | ICD-10-CM

## 2018-08-21 DIAGNOSIS — I2510 Atherosclerotic heart disease of native coronary artery without angina pectoris: ICD-10-CM

## 2018-08-21 DIAGNOSIS — I219 Acute myocardial infarction, unspecified: ICD-10-CM

## 2018-08-21 DIAGNOSIS — E7800 Pure hypercholesterolemia, unspecified: ICD-10-CM

## 2018-08-21 DIAGNOSIS — Z955 Presence of coronary angioplasty implant and graft: ICD-10-CM

## 2018-08-21 DIAGNOSIS — E119 Type 2 diabetes mellitus without complications: ICD-10-CM

## 2018-08-21 DIAGNOSIS — I10 Essential (primary) hypertension: ICD-10-CM

## 2018-08-27 ENCOUNTER — Encounter: Admit: 2018-08-27 | Discharge: 2018-08-27 | Payer: MEDICARE

## 2018-08-27 DIAGNOSIS — I2510 Atherosclerotic heart disease of native coronary artery without angina pectoris: Principal | ICD-10-CM

## 2018-08-27 DIAGNOSIS — E7800 Pure hypercholesterolemia, unspecified: ICD-10-CM

## 2018-11-11 ENCOUNTER — Encounter: Admit: 2018-11-11 | Discharge: 2018-11-11 | Payer: MEDICARE

## 2018-11-11 DIAGNOSIS — M25572 Pain in left ankle and joints of left foot: Principal | ICD-10-CM

## 2018-11-13 ENCOUNTER — Ambulatory Visit: Admit: 2018-11-13 | Discharge: 2018-11-13 | Payer: MEDICARE

## 2018-11-13 ENCOUNTER — Encounter: Admit: 2018-11-13 | Discharge: 2018-11-13 | Payer: MEDICARE

## 2018-11-13 DIAGNOSIS — I10 Essential (primary) hypertension: ICD-10-CM

## 2018-11-13 DIAGNOSIS — E119 Type 2 diabetes mellitus without complications: ICD-10-CM

## 2018-11-13 DIAGNOSIS — G8929 Other chronic pain: ICD-10-CM

## 2018-11-13 DIAGNOSIS — C4490 Unspecified malignant neoplasm of skin, unspecified: ICD-10-CM

## 2018-11-13 DIAGNOSIS — Z96662 Presence of left artificial ankle joint: Secondary | ICD-10-CM

## 2018-11-13 DIAGNOSIS — R011 Cardiac murmur, unspecified: ICD-10-CM

## 2018-11-13 DIAGNOSIS — M25572 Pain in left ankle and joints of left foot: Principal | ICD-10-CM

## 2018-11-13 DIAGNOSIS — Z955 Presence of coronary angioplasty implant and graft: ICD-10-CM

## 2018-11-13 DIAGNOSIS — E785 Hyperlipidemia, unspecified: Principal | ICD-10-CM

## 2018-11-13 DIAGNOSIS — T148XXA Other injury of unspecified body region, initial encounter: ICD-10-CM

## 2018-11-13 DIAGNOSIS — I2510 Atherosclerotic heart disease of native coronary artery without angina pectoris: ICD-10-CM

## 2018-11-13 DIAGNOSIS — I219 Acute myocardial infarction, unspecified: ICD-10-CM

## 2018-11-13 DIAGNOSIS — I350 Nonrheumatic aortic (valve) stenosis: ICD-10-CM

## 2018-11-25 ENCOUNTER — Encounter: Admit: 2018-11-25 | Discharge: 2018-11-25 | Payer: MEDICARE

## 2018-11-25 MED ORDER — ATORVASTATIN 40 MG PO TAB
ORAL_TABLET | Freq: Every day | 0 refills | Status: AC
Start: 2018-11-25 — End: 2019-03-11

## 2018-11-27 ENCOUNTER — Encounter: Admit: 2018-11-27 | Discharge: 2018-11-27 | Payer: MEDICARE

## 2018-11-27 DIAGNOSIS — E7800 Pure hypercholesterolemia, unspecified: ICD-10-CM

## 2018-11-27 DIAGNOSIS — I2510 Atherosclerotic heart disease of native coronary artery without angina pectoris: Principal | ICD-10-CM

## 2018-11-27 LAB — LIPID PROFILE
Lab: 13
Lab: 3
Lab: 56
Lab: 66
Lab: 147
Lab: 78

## 2019-03-11 ENCOUNTER — Encounter: Admit: 2019-03-11 | Discharge: 2019-03-11

## 2019-03-11 MED ORDER — ATORVASTATIN 40 MG PO TAB
ORAL_TABLET | Freq: Every day | 2 refills | Status: DC
Start: 2019-03-11 — End: 2019-12-03

## 2019-06-06 ENCOUNTER — Encounter: Admit: 2019-06-06 | Discharge: 2019-06-06 | Payer: MEDICARE

## 2019-07-16 ENCOUNTER — Encounter: Admit: 2019-07-16 | Discharge: 2019-07-16 | Payer: MEDICARE

## 2019-07-16 DIAGNOSIS — I350 Nonrheumatic aortic (valve) stenosis: Secondary | ICD-10-CM

## 2019-07-16 DIAGNOSIS — I2510 Atherosclerotic heart disease of native coronary artery without angina pectoris: Secondary | ICD-10-CM

## 2019-07-16 DIAGNOSIS — I10 Essential (primary) hypertension: Secondary | ICD-10-CM

## 2019-07-16 NOTE — Telephone Encounter
Contact patient by phone on 07/16/2019 to schedule annual follow up appointment with Dr. Tyson Alias in [facility]. Patient is scheduled for his annual follow up with Dr. Tyson Alias in [facility] on 09/24/2019. Patient is needing to complete an Echocardiogram per [facility]. Needing a new order placed or the original order date extended due to the current order being expired. Patient wants to complete Echocardiogram in [facility] in December 2020. Needing order sent to [facility] after order has been updated. Patient voiced understanding that [facility] Scheduling will contact patient to schedule Echocardiogram.

## 2019-07-22 ENCOUNTER — Encounter: Admit: 2019-07-22 | Discharge: 2019-07-22 | Payer: MEDICARE

## 2019-08-26 ENCOUNTER — Encounter: Admit: 2019-08-26 | Discharge: 2019-08-26 | Payer: MEDICARE

## 2019-08-26 DIAGNOSIS — I10 Essential (primary) hypertension: Secondary | ICD-10-CM

## 2019-08-26 DIAGNOSIS — I350 Nonrheumatic aortic (valve) stenosis: Secondary | ICD-10-CM

## 2019-08-26 DIAGNOSIS — I2510 Atherosclerotic heart disease of native coronary artery without angina pectoris: Secondary | ICD-10-CM

## 2019-08-26 NOTE — Telephone Encounter
-----   Message from Michiel Cowboy, MD sent at 08/26/2019 12:33 PM CST -----  [facility], I'm seeing him in a month and will review this, but can you just give him a quick call to let him know that the echo looks about the same as last year.  Thanks.    Cc:  Dr. Shon Hough

## 2019-09-24 ENCOUNTER — Encounter: Admit: 2019-09-24 | Discharge: 2019-09-24 | Payer: MEDICARE

## 2019-09-24 DIAGNOSIS — E7800 Pure hypercholesterolemia, unspecified: Secondary | ICD-10-CM

## 2019-09-24 DIAGNOSIS — T148XXA Other injury of unspecified body region, initial encounter: Secondary | ICD-10-CM

## 2019-09-24 DIAGNOSIS — C4490 Unspecified malignant neoplasm of skin, unspecified: Secondary | ICD-10-CM

## 2019-09-24 DIAGNOSIS — Z955 Presence of coronary angioplasty implant and graft: Secondary | ICD-10-CM

## 2019-09-24 DIAGNOSIS — E119 Type 2 diabetes mellitus without complications: Secondary | ICD-10-CM

## 2019-09-24 DIAGNOSIS — I2510 Atherosclerotic heart disease of native coronary artery without angina pectoris: Secondary | ICD-10-CM

## 2019-09-24 DIAGNOSIS — R011 Cardiac murmur, unspecified: Secondary | ICD-10-CM

## 2019-09-24 DIAGNOSIS — E785 Hyperlipidemia, unspecified: Secondary | ICD-10-CM

## 2019-09-24 DIAGNOSIS — I10 Essential (primary) hypertension: Secondary | ICD-10-CM

## 2019-09-24 DIAGNOSIS — I219 Acute myocardial infarction, unspecified: Secondary | ICD-10-CM

## 2019-09-24 DIAGNOSIS — I350 Nonrheumatic aortic (valve) stenosis: Secondary | ICD-10-CM

## 2019-09-24 NOTE — Progress Notes
Date of Service: 09/24/2019    Darius Cherry is a 69 y.o. male.       HPI      Darius Cherry was in the [place] clinic today for follow-up regarding coronary disease and aortic stenosis.  He has stayed very active in the past year and denies any cardiovascular symptoms.  He specifically denies any chest discomfort, breathlessness, or palpitations.  He has had no syncope or near syncope.  He denies any TIA or stroke symptoms.  His exercise tolerance is unchanged.         Vitals:    09/24/19 0902   BP: 118/72   BP Source: Arm, Left Upper   Patient Position: Sitting   Pulse: 71   Temp: 37.2 ?C (98.9 ?F)   TempSrc: Oral   SpO2: 96%   Weight: 96.8 kg (213 lb 6.4 oz)   Height: 1.803 m (5' 11)   PainSc: Zero     Body mass index is 29.76 kg/m?Marland Kitchen     Past Medical History  Patient Active Problem List    Diagnosis Date Noted   ? Pain from implanted hardware 03/28/2018   ? Left ankle pain 10/08/2016     Added automatically from request for surgery [id number]     ? 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 Essential hypertension 05/12/2015   ? Nonrheumatic aortic valve stenosis 05/12/2015   ? Type 2 diabetes mellitus without complication, without long-term current use of insulin (HCC) 05/12/2015   ? Overweight (BMI 25.0-29.9) 11/11/2014   ? Hyperlipemia 04/26/2009   ? CAD (coronary artery disease) 04/26/2009     10/22/05: Per Dr. Ludwig Lean: Successful percutaneous coronary intervention of the circumflex obtuse marginal bifurcation with a 3.0 x 23 mm Vision bare metal stent extending from the circumflex into the obtuse marginal and a 3.5 x 12 mm Vision bare metal stent in the circumflex at the bifurcation utilizing a Kulot stent technique.     05/20/12: Drug-eluting stent PCI to prox CFX and LAD by Dr. Bufford Buttner           Review of Systems   Constitution: Negative.   HENT: Negative.    Eyes: Negative.    Cardiovascular: Negative.    Respiratory: Negative.    Endocrine: Negative.    Hematologic/Lymphatic: Negative.    Skin: Negative. Musculoskeletal: Negative.    Gastrointestinal: Negative.    Genitourinary: Negative.    Neurological: Negative.    Psychiatric/Behavioral: Negative.    Allergic/Immunologic: Negative.        Physical Exam    Physical Exam   General Appearance: no distress   Skin: warm, no ulcers or xanthomas   Digits and Nails: no cyanosis or clubbing   Eyes: conjunctivae and lids normal, pupils are equal and round   Teeth/Gums/Palate: dentition unremarkable, no lesions   Lips & Oral Mucosa: no pallor or cyanosis   Neck Veins: normal JVP , neck veins are not distended   Thyroid: no nodules, masses, tenderness or enlargement   Chest Inspection: chest is normal in appearance   Respiratory Effort: breathing comfortably, no respiratory distress   Auscultation/Percussion: lungs clear to auscultation, no rales or rhonchi, no wheezing   PMI: PMI not enlarged or displaced   Cardiac Rhythm: regular rhythm and normal rate   Cardiac Auscultation: S1, S2 normal, no rub, no gallop   Murmurs: grade 2 systolic murmur   Peripheral Circulation: normal peripheral circulation   Carotid Arteries: normal carotid upstroke bilaterally, no bruits   Radial Arteries:  normal symmetric radial pulses   Abdominal Aorta: no abdominal aortic bruit   Pedal Pulses: normal symmetric pedal pulses   Lower Extremity Edema: no lower extremity edema   Abdominal Exam: soft, non-tender, no masses, bowel sounds normal   Liver & Spleen: no organomegaly   Gait & Station: walks without assistance   Muscle Strength: normal muscle tone   Orientation: oriented to time, place and person   Affect & Mood: appropriate and sustained affect   Language and Memory: patient responsive and seems to comprehend information   Neurologic Exam: neurological assessment grossly intact   Other: moves all extremities      Problems Addressed Today  Encounter Diagnoses   Name Primary?   ? Pure hypercholesterolemia ? Coronary artery disease involving native coronary artery of native heart without angina pectoris    ? Essential hypertension    ? Nonrheumatic aortic valve stenosis        Assessment and Plan       Hyperlipemia  Lab Results   Component Value Date    CHOL 147 11/27/2018    TRIG 66 11/27/2018    HDL 56 11/27/2018    LDL 78 11/27/2018    VLDL 13 11/27/2018    NONHDLCHOL 115 05/20/2012    CHOLHDLC 3 11/27/2018      LDL treated to goal.    CAD (coronary artery disease)  I do not really see a role for surveillance stress testing in his case.  When he has had progression of his coronary disease it has taken place in the form of an acute coronary syndrome.  Hopefully with good control of his lipids and blood pressure as well as tobacco cessation will be less likely to have acute events going forward.    Essential hypertension  He appears to be treated to goal.    Nonrheumatic aortic valve stenosis  He has asymptomatic, moderate aortic stenosis.  His recent echocardiogram shows little significant progression compared to a year ago.  I suggested a follow-up echocardiogram in about a year.      Current Medications (including today's revisions)  ? acetaminophen (TYLENOL) 325 mg tablet Take two tablets by mouth every 4 hours as needed.   ? amoxicillin(+) (AMOXIL) 500 mg tablet 2g 1hr prior to procedure; 1gm 6hr after procedure (Patient taking differently: as Needed (prior to dental procedures).)   ? Aspirin 81 mg Tab Take 1 tablet by mouth every morning.   ? atorvastatin (LIPITOR) 40 mg tablet Take 1 tablet by mouth once daily   ? calcium carbonate/vitamin D-3 (OSCAL-500+D) 1250 mg/200 unit tablet Take 1 tablet by mouth twice daily. Calcium Carb 1250mg  delivers 500mg  elemental Ca   ? empagliflozin (JARDIANCE) 25 mg tab Take 1 tablet by mouth every morning.   ? metoprolol (LOPRESSOR) 25 mg tablet Take 0.5 Tabs by mouth twice daily. The dose is 12.5 mg (half tablet) po BID. ? nitroglycerin (NITROSTAT) 0.4 mg tablet Take one sublingual nitro Q 5 minX three for chest pain   ? SITagliptin-metformin (JANUMET XR) 50-1,000 mg TM24 Take 1 tablet by mouth twice daily.

## 2019-09-24 NOTE — Assessment & Plan Note
He has asymptomatic, moderate aortic stenosis.  His recent echocardiogram shows little significant progression compared to a year ago.  I suggested a follow-up echocardiogram in about a year.

## 2019-09-24 NOTE — Assessment & Plan Note
Lab Results   Component Value Date    CHOL 147 11/27/2018    TRIG 66 11/27/2018    HDL 56 11/27/2018    LDL 78 11/27/2018    VLDL 13 11/27/2018    NONHDLCHOL 115 05/20/2012    CHOLHDLC 3 11/27/2018      LDL treated to goal.

## 2019-09-24 NOTE — Assessment & Plan Note
I do not really see a role for surveillance stress testing in his case.  When he has had progression of his coronary disease it has taken place in the form of an acute coronary syndrome.  Hopefully with good control of his lipids and blood pressure as well as tobacco cessation will be less likely to have acute events going forward.

## 2019-09-24 NOTE — Assessment & Plan Note
He appears to be treated to goal.

## 2019-09-29 ENCOUNTER — Encounter: Admit: 2019-09-29 | Discharge: 2019-09-29 | Payer: MEDICARE

## 2019-09-29 NOTE — Telephone Encounter
Received call from Dr. DePreist's office.  They are planning an exicision of a lesion on his left check and are asking for approval to hold Effient for 3 days prior to the procedure.      Our records do not show patient on that medication.  Called and spoke with staff at Dr. DePreist's office [phone]).  They will verify with the patient and call us back with any additional questions concerns, or needs.

## 2019-10-23 IMAGING — US ECHOCOMPL
1 series · 14 of 24 positions shown · non-contrast
Comparison: none

[Series 1: us echo 2d, wo/w m-mode, compl · 106 acquisitions, 14 frames shown]
[im 1/106]
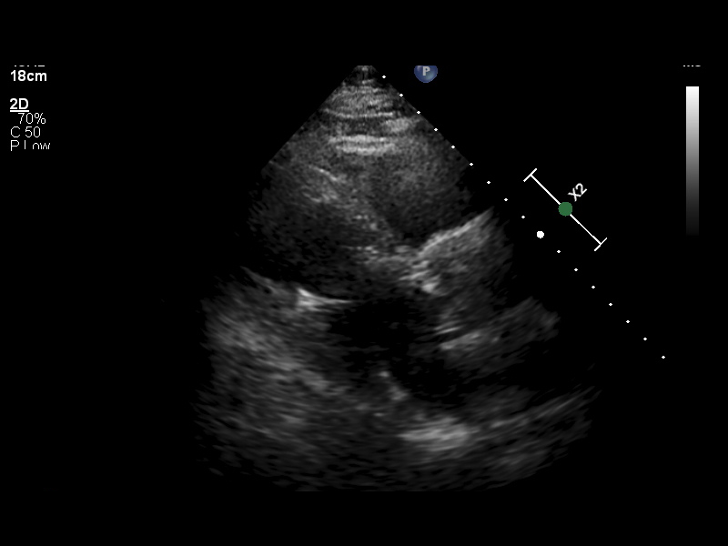
[im 10/106]
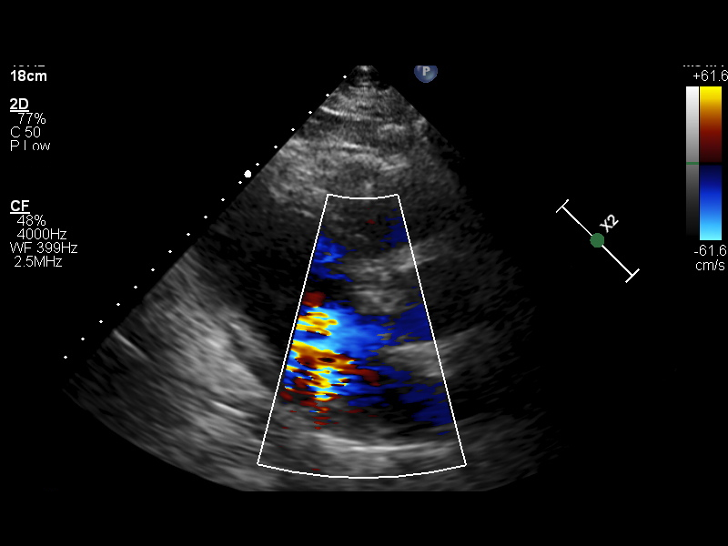
[im 19/106]
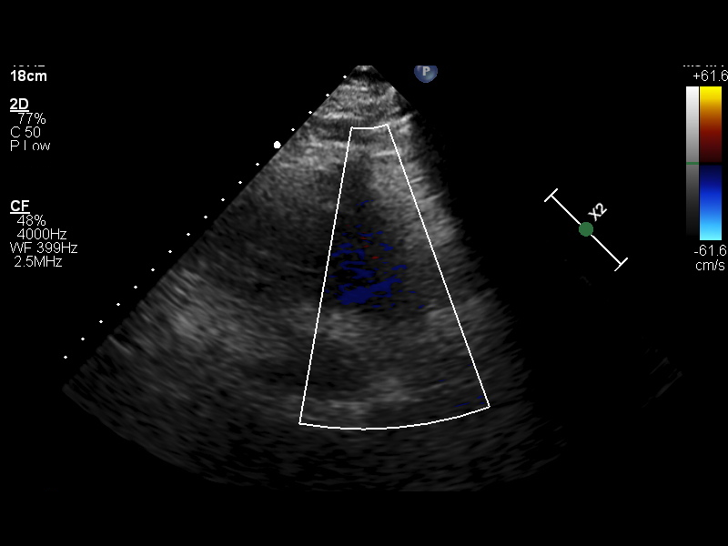
[im 28/106]
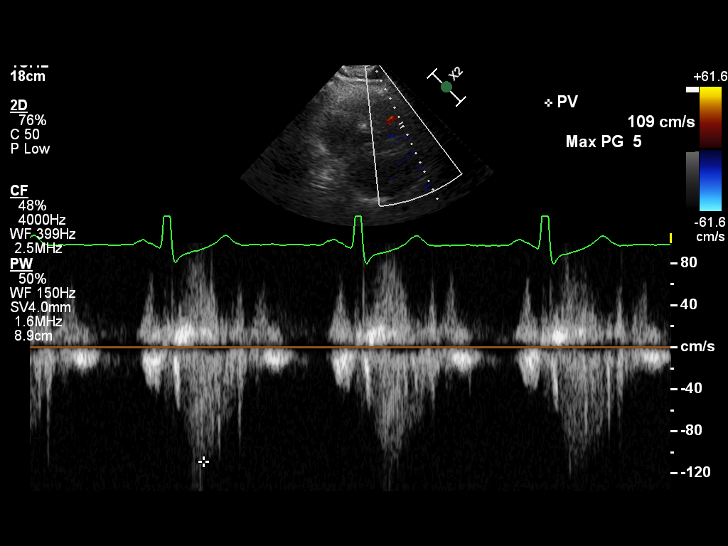
[im 32/106]
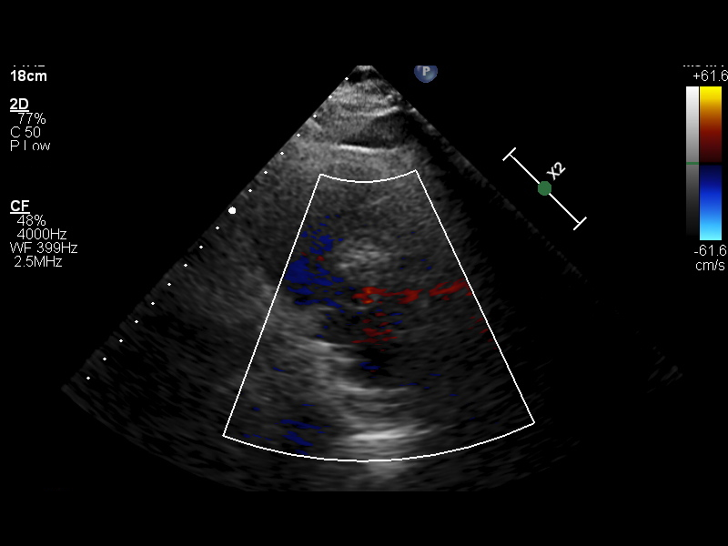
[im 42/106]
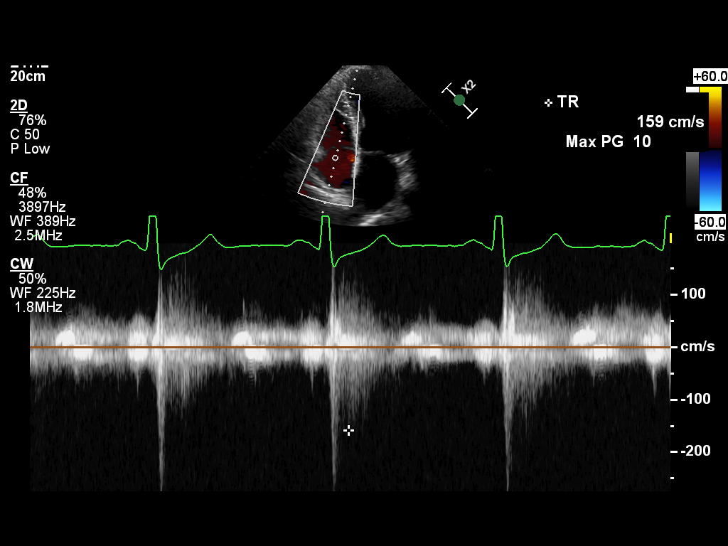
[im 51/106]
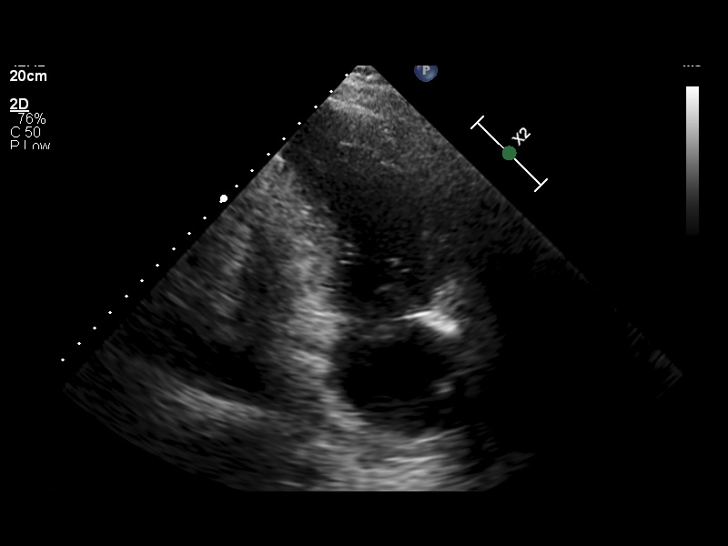
[im 55/106]
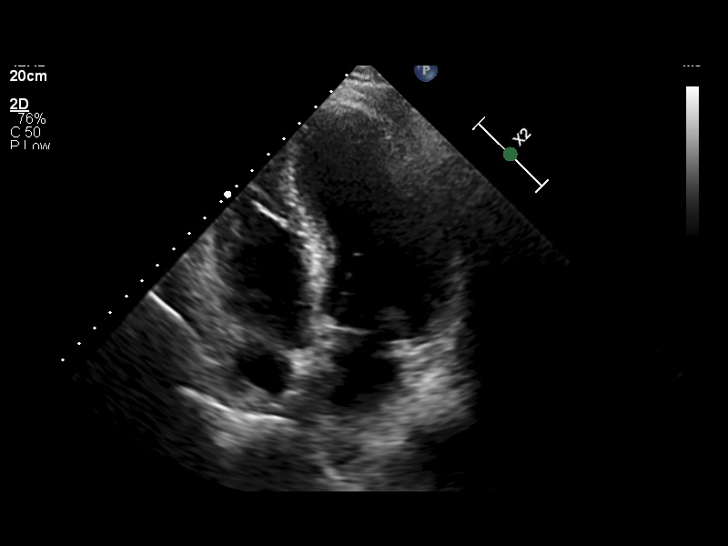
[im 64/106]
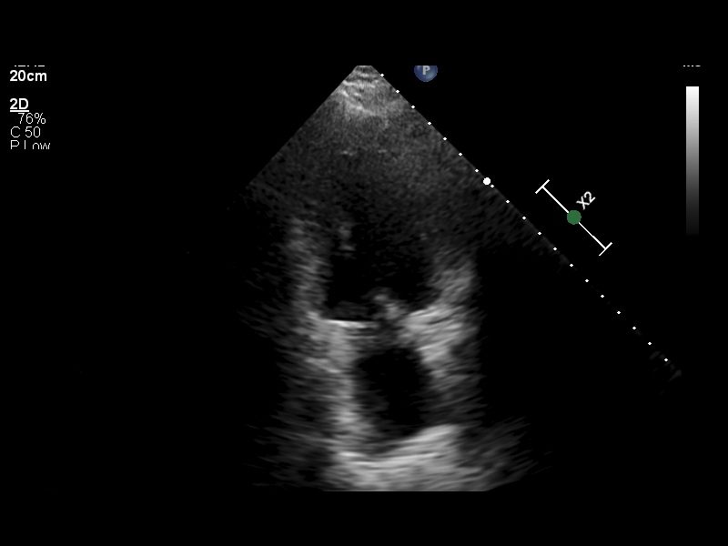
[im 69/106]
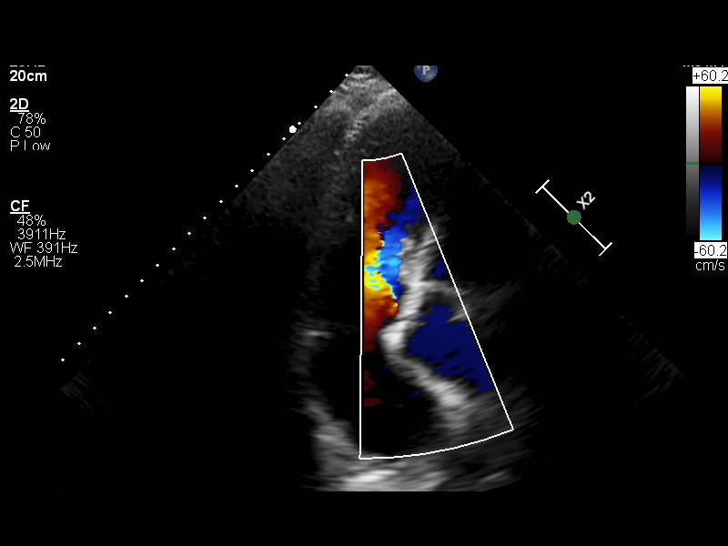
[im 83/106]
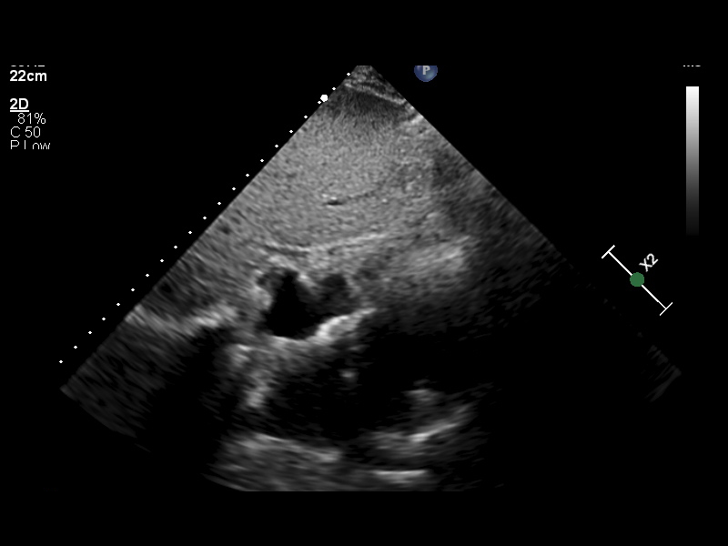
[im 87/106]
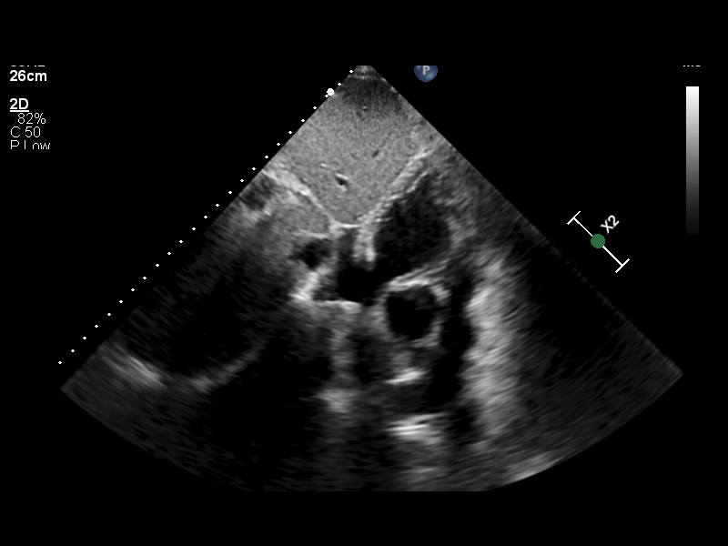
[im 96/106]
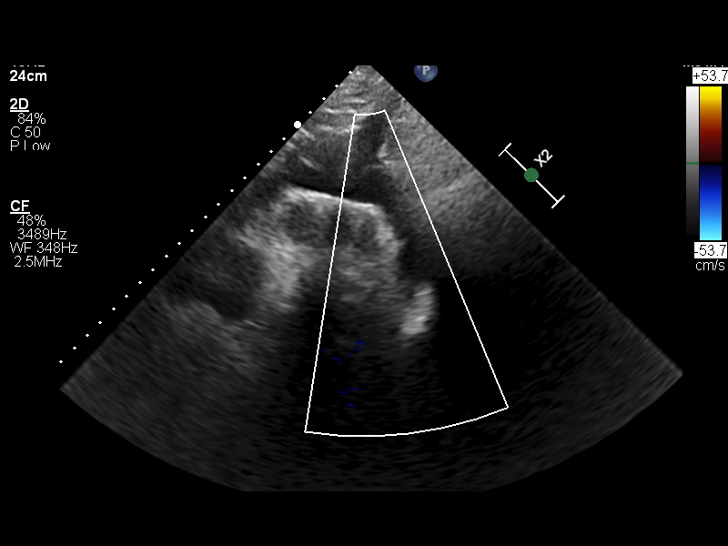
[im 106/106]
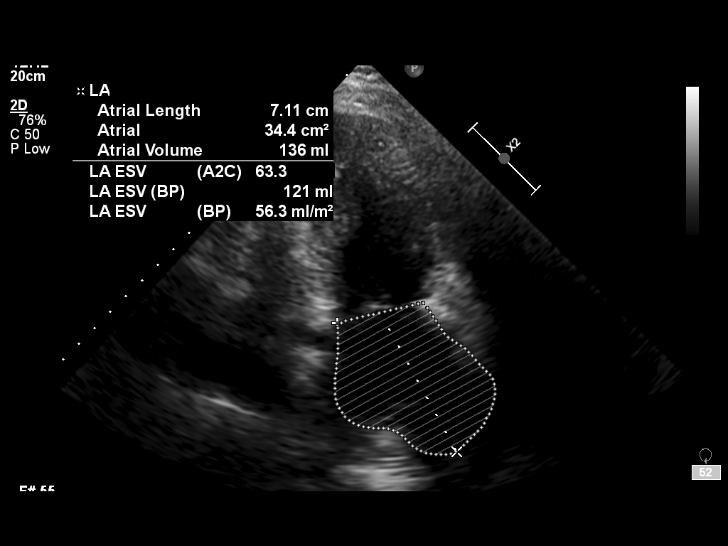

[14 of 24 positions shown; findings below may reference images not displayed]

FINAL REPORT IS SCANNED IN THE PATIENT'S EMR.

Tech Notes:

jl

## 2019-12-03 ENCOUNTER — Encounter: Admit: 2019-12-03 | Discharge: 2019-12-03 | Payer: MEDICARE

## 2019-12-03 DIAGNOSIS — E7800 Pure hypercholesterolemia, unspecified: Secondary | ICD-10-CM

## 2019-12-03 MED ORDER — ATORVASTATIN 40 MG PO TAB
ORAL_TABLET | Freq: Every day | 3 refills | Status: AC
Start: 2019-12-03 — End: ?

## 2020-01-18 ENCOUNTER — Encounter: Admit: 2020-01-18 | Discharge: 2020-01-18 | Payer: MEDICARE

## 2020-01-18 DIAGNOSIS — E7800 Pure hypercholesterolemia, unspecified: Secondary | ICD-10-CM

## 2020-01-18 LAB — LIPID PROFILE
Lab: 102
Lab: 152
Lab: 20
Lab: 3
Lab: 58
Lab: 74

## 2020-05-11 ENCOUNTER — Encounter: Admit: 2020-05-11 | Discharge: 2020-05-11 | Payer: MEDICARE

## 2020-05-11 DIAGNOSIS — M19072 Primary osteoarthritis, left ankle and foot: Secondary | ICD-10-CM

## 2020-05-12 ENCOUNTER — Ambulatory Visit: Admit: 2020-05-12 | Discharge: 2020-05-12 | Payer: MEDICARE

## 2020-05-12 ENCOUNTER — Encounter: Admit: 2020-05-12 | Discharge: 2020-05-12 | Payer: MEDICARE

## 2020-05-12 DIAGNOSIS — M19072 Primary osteoarthritis, left ankle and foot: Secondary | ICD-10-CM

## 2020-05-12 DIAGNOSIS — Z96662 Presence of left artificial ankle joint: Secondary | ICD-10-CM

## 2020-11-08 ENCOUNTER — Encounter: Admit: 2020-11-08 | Discharge: 2020-11-08 | Payer: MEDICARE

## 2020-11-15 ENCOUNTER — Encounter: Admit: 2020-11-15 | Discharge: 2020-11-15 | Payer: MEDICARE

## 2020-11-15 DIAGNOSIS — I2510 Atherosclerotic heart disease of native coronary artery without angina pectoris: Secondary | ICD-10-CM

## 2020-11-15 DIAGNOSIS — I10 Essential (primary) hypertension: Secondary | ICD-10-CM

## 2020-11-15 DIAGNOSIS — T148XXA Other injury of unspecified body region, initial encounter: Secondary | ICD-10-CM

## 2020-11-15 DIAGNOSIS — E119 Type 2 diabetes mellitus without complications: Secondary | ICD-10-CM

## 2020-11-15 DIAGNOSIS — Z955 Presence of coronary angioplasty implant and graft: Secondary | ICD-10-CM

## 2020-11-15 DIAGNOSIS — E785 Hyperlipidemia, unspecified: Secondary | ICD-10-CM

## 2020-11-15 DIAGNOSIS — I350 Nonrheumatic aortic (valve) stenosis: Secondary | ICD-10-CM

## 2020-11-15 DIAGNOSIS — R011 Cardiac murmur, unspecified: Secondary | ICD-10-CM

## 2020-11-15 DIAGNOSIS — E7800 Pure hypercholesterolemia, unspecified: Secondary | ICD-10-CM

## 2020-11-15 DIAGNOSIS — C4490 Unspecified malignant neoplasm of skin, unspecified: Secondary | ICD-10-CM

## 2020-11-15 DIAGNOSIS — I219 Acute myocardial infarction, unspecified: Secondary | ICD-10-CM

## 2020-11-15 NOTE — Progress Notes
Date of Service: 11/15/2020    Darius Cherry is a 70 y.o. male.       HPI     Darius Cherry was in the [place] clinic today for follow-up regarding coronary disease and aortic stenosis.  He has stayed very active in the past year and denies any cardiovascular symptoms.  He specifically denies any chest discomfort, breathlessness, or palpitations.  He has had no syncope or near syncope.  He denies any TIA or stroke symptoms.  His exercise tolerance is unchanged.  ?       Vitals:    11/15/20 0937   BP: 122/76   BP Source: Arm, Left Upper   Patient Position: Sitting   Pulse: 58   SpO2: 97%   Weight: 97.9 kg (215 lb 12.8 oz)   Height: 180.3 cm (5' 11)   PainSc: Zero     Body mass index is 30.1 kg/m?Marland Kitchen     Past Medical History  Patient Active Problem List    Diagnosis Date Noted   ? Pain from implanted hardware 03/28/2018   ? Left ankle pain 10/08/2016     Added automatically from request for surgery [id number]     ? 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 Essential hypertension 05/12/2015   ? Nonrheumatic aortic valve stenosis 05/12/2015   ? Type 2 diabetes mellitus without complication, without long-term current use of insulin (HCC) 05/12/2015   ? Overweight (BMI 25.0-29.9) 11/11/2014   ? Hyperlipemia 04/26/2009   ? CAD (coronary artery disease) 04/26/2009     10/22/05: Per Dr. Ludwig Lean: Successful percutaneous coronary intervention of the circumflex obtuse marginal bifurcation with a 3.0 x 23 mm Vision bare metal stent extending from the circumflex into the obtuse marginal and a 3.5 x 12 mm Vision bare metal stent in the circumflex at the bifurcation utilizing a Kulot stent technique.     05/20/12: Drug-eluting stent PCI to prox CFX and LAD by Dr. Bufford Buttner           Review of Systems   Constitutional: Negative.   HENT: Negative.    Eyes: Negative.    Cardiovascular: Negative.    Respiratory: Negative.    Endocrine: Negative.    Hematologic/Lymphatic: Negative.    Skin: Negative.    Musculoskeletal: Negative. Gastrointestinal: Negative.    Genitourinary: Negative.    Neurological: Negative.    Psychiatric/Behavioral: Negative.    Allergic/Immunologic: Negative.        Physical Exam    Physical Exam   General Appearance: no distress   Skin: warm, no ulcers or xanthomas   Digits and Nails: no cyanosis or clubbing   Eyes: conjunctivae and lids normal, pupils are equal and round   Teeth/Gums/Palate: dentition unremarkable, no lesions   Lips & Oral Mucosa: no pallor or cyanosis   Neck Veins: normal JVP , neck veins are not distended   Thyroid: no nodules, masses, tenderness or enlargement   Chest Inspection: chest is normal in appearance   Respiratory Effort: breathing comfortably, no respiratory distress   Auscultation/Percussion: lungs clear to auscultation, no rales or rhonchi, no wheezing   PMI: PMI not enlarged or displaced   Cardiac Rhythm: regular rhythm and normal rate   Cardiac Auscultation: S1, S2 normal, no rub, no gallop   Murmurs: grade 3 systolic murmur   Peripheral Circulation: normal peripheral circulation   Carotid Arteries: normal carotid upstroke bilaterally, no bruits   Radial Arteries: normal symmetric radial pulses   Abdominal Aorta: no abdominal aortic bruit  Pedal Pulses: normal symmetric pedal pulses   Lower Extremity Edema: no lower extremity edema   Abdominal Exam: soft, non-tender, no masses, bowel sounds normal   Liver & Spleen: no organomegaly   Gait & Station: walks without assistance   Muscle Strength: normal muscle tone   Orientation: oriented to time, place and person   Affect & Mood: appropriate and sustained affect   Language and Memory: patient responsive and seems to comprehend information   Neurologic Exam: neurological assessment grossly intact   Other: moves all extremities      Cardiovascular Health Factors  Vitals BP Readings from Last 3 Encounters:   11/15/20 122/76   09/24/19 118/72   08/26/19 127/83     Wt Readings from Last 3 Encounters:   11/15/20 97.9 kg (215 lb 12.8 oz) 09/24/19 96.8 kg (213 lb 6.4 oz)   08/26/19 96 kg (211 lb 9.6 oz)     BMI Readings from Last 3 Encounters:   11/15/20 30.10 kg/m?   09/24/19 29.76 kg/m?   08/26/19 29.51 kg/m?      Smoking Social History     Tobacco Use   Smoking Status Former Smoker   ? Packs/day: 2.00   ? Years: 5.00   ? Pack years: 10.00   ? Types: Cigarettes   ? Quit date: 02/15/2006   ? Years since quitting: 14.7   Smokeless Tobacco Never Used      Lipid Profile Cholesterol   Date Value Ref Range Status   01/18/2020 152  Final     HDL   Date Value Ref Range Status   01/18/2020 58  Final     LDL   Date Value Ref Range Status   01/18/2020 74  Final     Triglycerides   Date Value Ref Range Status   01/18/2020 102  Final      Blood Sugar Hemoglobin A1C   Date Value Ref Range Status   07/21/2020 7.5 (H) 4.5 - 6.5 Final     Glucose   Date Value Ref Range Status   03/29/2018 156 (H) 70 - 100 MG/DL Final   45/40/9811 914 (H) 70 - 100 MG/DL Final   78/29/5621 308 (H) 80 - 115 Final   10/23/2005 140 (H) 70 - 110 MG/DL Final   65/78/4696 295 (H) 70 - 110 MG/DL Final     Glucose, POC   Date Value Ref Range Status   03/28/2018 179 (H) 70 - 100 MG/DL Final   28/41/3244 010 (H) 70 - 100 MG/DL Final   27/25/3664 403 (H) 70 - 100 MG/DL Final          Problems Addressed Today  Encounter Diagnoses   Name Primary?   ? Nonrheumatic aortic valve stenosis Yes   ? Coronary artery disease involving native coronary artery of native heart without angina pectoris    ? Essential hypertension    ? Pure hypercholesterolemia        Assessment and Plan       CAD (coronary artery disease)  History of coronary disease with no symptoms.  If his aortic stenosis has progressed to the point that we need to consider valve replacement he will undergo coronary arteriography anyway so I did not set up any sort of stress test.    Essential hypertension  Blood pressure looks fine on the current medical program.    Hyperlipemia  Lab Results   Component Value Date    CHOL 152 01/18/2020 TRIG 102 01/18/2020  HDL 58 01/18/2020    LDL 74 01/18/2020    VLDL 20 01/18/2020    NONHDLCHOL 115 05/20/2012    CHOLHDLC 3 01/18/2020      LDL treated to goal.      Nonrheumatic aortic valve stenosis  I ordered an echocardiogram today.  His valve is getting pretty close to the point that we will need to consider replacement.  I mentioned the possibility of a referral to our valve clinic as well as the possibility that he might be a candidate for a transcatheter valve.      Current Medications (including today's revisions)  ? acetaminophen (TYLENOL) 325 mg tablet Take two tablets by mouth every 4 hours as needed.   ? amoxicillin(+) (AMOXIL) 500 mg tablet 2g 1hr prior to procedure; 1gm 6hr after procedure (Patient taking differently: as Needed (prior to dental procedures).)   ? Aspirin 81 mg Tab Take 1 tablet by mouth every morning.   ? atorvastatin (LIPITOR) 40 mg tablet Take 1 tablet by mouth once daily   ? calcium carbonate/vitamin D-3 (OSCAL-500+D) 1250 mg/200 unit tablet Take 1 tablet by mouth twice daily. Calcium Carb 1250mg  delivers 500mg  elemental Ca   ? empagliflozin (JARDIANCE) 25 mg tab Take 1 tablet by mouth every morning.   ? metoprolol (LOPRESSOR) 25 mg tablet Take 0.5 Tabs by mouth twice daily. The dose is 12.5 mg (half tablet) po BID.   ? nitroglycerin (NITROSTAT) 0.4 mg tablet Take one sublingual nitro Q 5 minX three for chest pain   ? SITagliptin-metformin (JANUMET XR) 50-1,000 mg TM24 Take 1 tablet by mouth twice daily.   ? tamsulosin (FLOMAX) 0.4 mg capsule      Total time spent on today's office visit was 30 minutes.  This includes face-to-face in person visit with patient as well as nonface-to-face time including review of the EMR, outside records, labs, radiologic studies, echocardiogram & other cardiovascular studies, formation of treatment plan, after visit summary, future disposition, and lastly on documentation.

## 2020-11-15 NOTE — Assessment & Plan Note
History of coronary disease with no symptoms.  If his aortic stenosis has progressed to the point that we need to consider valve replacement he will undergo coronary arteriography anyway so I did not set up any sort of stress test.

## 2020-11-15 NOTE — Assessment & Plan Note
I ordered an echocardiogram today.  His valve is getting pretty close to the point that we will need to consider replacement.  I mentioned the possibility of a referral to our valve clinic as well as the possibility that he might be a candidate for a transcatheter valve.

## 2020-11-15 NOTE — Assessment & Plan Note
Lab Results   Component Value Date    CHOL 152 01/18/2020    TRIG 102 01/18/2020    HDL 58 01/18/2020    LDL 74 01/18/2020    VLDL 20 01/18/2020    NONHDLCHOL 115 05/20/2012    CHOLHDLC 3 01/18/2020      LDL treated to goal.

## 2020-11-15 NOTE — Assessment & Plan Note
Blood pressure looks fine on the current medical program.

## 2020-12-02 ENCOUNTER — Encounter: Admit: 2020-12-02 | Discharge: 2020-12-02 | Payer: MEDICARE

## 2020-12-02 ENCOUNTER — Ambulatory Visit: Admit: 2020-12-02 | Discharge: 2020-12-02 | Payer: MEDICARE

## 2020-12-02 DIAGNOSIS — I350 Nonrheumatic aortic (valve) stenosis: Secondary | ICD-10-CM

## 2020-12-04 ENCOUNTER — Encounter: Admit: 2020-12-04 | Discharge: 2020-12-04 | Payer: MEDICARE

## 2020-12-04 MED ORDER — ATORVASTATIN 40 MG PO TAB
ORAL_TABLET | Freq: Every day | 0 refills
Start: 2020-12-04 — End: ?

## 2020-12-07 ENCOUNTER — Encounter: Admit: 2020-12-07 | Discharge: 2020-12-07 | Payer: MEDICARE

## 2020-12-07 NOTE — Telephone Encounter
-----   Message from Michiel Cowboy, MD sent at 12/07/2020  8:00 AM CDT -----  [facility], can you please let Darius Cherry know that the echo shows fairly minimal progression in AS compared to a year ago.  We'll need another echo in a year or sooner if he has symptoms.  Thanks!    Cc:  Dr. Shon Hough

## 2020-12-07 NOTE — Telephone Encounter
Called and discussed results with patient.  No questions at this time.  Pt will callback with any questions, concerns or problems.

## 2021-05-31 ENCOUNTER — Encounter: Admit: 2021-05-31 | Discharge: 2021-05-31 | Payer: MEDICARE

## 2021-05-31 MED ORDER — ATORVASTATIN 40 MG PO TAB
ORAL_TABLET | Freq: Every day | 0 refills | Status: AC
Start: 2021-05-31 — End: ?

## 2021-08-25 ENCOUNTER — Encounter: Admit: 2021-08-25 | Discharge: 2021-08-25 | Payer: MEDICARE

## 2021-08-25 MED ORDER — ATORVASTATIN 40 MG PO TAB
ORAL_TABLET | Freq: Every day | 3 refills | Status: AC
Start: 2021-08-25 — End: ?

## 2021-12-22 ENCOUNTER — Ambulatory Visit: Admit: 2021-12-22 | Discharge: 2021-12-22 | Payer: MEDICARE

## 2021-12-22 ENCOUNTER — Encounter: Admit: 2021-12-22 | Discharge: 2021-12-22 | Payer: MEDICARE

## 2021-12-22 DIAGNOSIS — I4891 Unspecified atrial fibrillation: Secondary | ICD-10-CM

## 2021-12-22 DIAGNOSIS — R002 Palpitations: Secondary | ICD-10-CM

## 2021-12-22 DIAGNOSIS — I10 Essential (primary) hypertension: Secondary | ICD-10-CM

## 2021-12-22 NOTE — Progress Notes
Ambulatory (External) Cardiac Monitor Enrollment Record     Placement Location: Home Enrollment  Vendor: [employer] ([employer])  Mobile Cardiac Telemetry (MCOT/MCT)?: No  Duration of Monitor (in days): 14  Monitor Diagnosis: Other (Unspecified atrial fibrillation - I48.91)  Secondary Monitor Diagnosis: Palpitations (R00.2)  Ordering Provider: Tyson Alias  AMB Monitor Serial Number: Home      Start Time and Date: 12/22/21 3:28 PM   Patient Name: Darius Cherry  DOB: 10/26/1950 03/03/49  MRN: [id number]  Sex: male  Mobile Phone Number: [phone] (mobile)  Home Phone Number: [phone]  Patient Address: [place] [address]  Insurance Coverage: MEDICARE PART A AND B  Insurance ID: [id number]  Insurance Group #:   Insurance Subscriber: Ione D  Implanted Cardiac Device Information: No results found for: [email or web address]      Patient instructed to contact company phone number on the monitor box with questions regarding billing, placement, troubleshooting.     [place]    ____________________________________________________________    Clinic Staff:  In Follow-up, send chart upon closing encounter to [facility]    [facility] Ambulatory Monitoring Team:  1. Schedule on appropriate template and check-in.   Clinic Placement Schedule on clinic location [facility] schedule   Home Enrollment Schedule on Home Enrollment schedule ([facility])   Given to patient in clinic for self-placement Schedule on Home Enrollment schedule ([facility])   Inpatient Schedule on [facility] AMBULATORY MONITORING template   2. Please enroll with appropriate vendor.

## 2022-01-04 ENCOUNTER — Encounter: Admit: 2022-01-04 | Discharge: 2022-01-04 | Payer: MEDICARE

## 2022-01-04 NOTE — Progress Notes
Records Request    Medical records request for continuation of care:    Darius Cherry, Darius Cherry.   DOB:  August 21, 1951      Patient has appointment on 01/18/2022   with  Dr. Tyson Alias* .    Please fax records to [place] [facility] [place]    Request records: STAT                EKG  (April 2023)                            Thank you,      [facility]  [address] [place] [address]  Phone:  [phone]  Fax:  [phone]

## 2022-01-08 ENCOUNTER — Encounter: Admit: 2022-01-08 | Discharge: 2022-01-08 | Payer: MEDICARE

## 2022-01-08 DIAGNOSIS — I4891 Unspecified atrial fibrillation: Secondary | ICD-10-CM

## 2022-01-18 ENCOUNTER — Encounter: Admit: 2022-01-18 | Discharge: 2022-01-18 | Payer: MEDICARE

## 2022-01-18 DIAGNOSIS — E7800 Pure hypercholesterolemia, unspecified: Secondary | ICD-10-CM

## 2022-01-18 DIAGNOSIS — I350 Nonrheumatic aortic (valve) stenosis: Secondary | ICD-10-CM

## 2022-01-18 DIAGNOSIS — E785 Hyperlipidemia, unspecified: Secondary | ICD-10-CM

## 2022-01-18 DIAGNOSIS — I10 Essential (primary) hypertension: Secondary | ICD-10-CM

## 2022-01-18 DIAGNOSIS — Z955 Presence of coronary angioplasty implant and graft: Secondary | ICD-10-CM

## 2022-01-18 DIAGNOSIS — R011 Cardiac murmur, unspecified: Secondary | ICD-10-CM

## 2022-01-18 DIAGNOSIS — T148XXA Other injury of unspecified body region, initial encounter: Secondary | ICD-10-CM

## 2022-01-18 DIAGNOSIS — R0989 Other specified symptoms and signs involving the circulatory and respiratory systems: Secondary | ICD-10-CM

## 2022-01-18 DIAGNOSIS — I4819 Other persistent atrial fibrillation: Secondary | ICD-10-CM

## 2022-01-18 DIAGNOSIS — C4490 Unspecified malignant neoplasm of skin, unspecified: Secondary | ICD-10-CM

## 2022-01-18 DIAGNOSIS — I2510 Atherosclerotic heart disease of native coronary artery without angina pectoris: Secondary | ICD-10-CM

## 2022-01-18 DIAGNOSIS — I219 Acute myocardial infarction, unspecified: Secondary | ICD-10-CM

## 2022-01-18 DIAGNOSIS — E119 Type 2 diabetes mellitus without complications: Secondary | ICD-10-CM

## 2022-01-18 NOTE — Progress Notes
Date of Service: 01/18/2022    Darius Cherry is a 71 y.o. male.       HPI     Darius Cherry was in the [place] clinic today for follow-up regarding persistent atrial fibrillation.  He was in Dr. Lorrin Mais office on April 3 and noted to have an irregular pulse.  The EKG confirmed the presence of atrial fibrillation.  He was started on Eliquis at that time.  He did not need additional rate control medication as his ventricular rate was not particularly rapid.    He was not specifically symptomatic but in retrospect he says that he has had a decline in exercise tolerance and a bit more fatigue since being in atrial fibrillation.    He had an echocardiogram that showed left atrial enlargement and moderate aortic stenosis.  LV systolic function remains normal.  He has moderate concentric left ventricular hypertrophy.    He wore a monitor for about 4 days and had 100% atrial fibrillation.    He denies any problems with angina nor is he had any TIA or stroke symptoms.  Has had no bleeding complications related to oral anticoagulation.  He denies any trouble with fluid retention or exertional breathlessness.         Vitals:    01/18/22 1002   BP: 108/76   BP Source: Arm, Left Upper   Pulse: 89   SpO2: 97%   O2 Percent: 97 %   O2 Device: None (Room air)   PainSc: Zero   Weight: 95.9 kg (211 lb 6.4 oz)   Height: 180.3 cm (5' 11)     Body mass index is 29.48 kg/m?Marland Kitchen     Past Medical History  Patient Active Problem List    Diagnosis Date Noted   ? Persistent atrial fibrillation (HCC) 01/18/2022     12/18/2021 - Incidental finding of AF during routine physical.  Eliquis started.     ? Pain from implanted hardware 03/28/2018   ? Left ankle pain 10/08/2016     Added automatically from request for surgery [id number]     ? 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 Essential hypertension 05/12/2015   ? Nonrheumatic aortic valve stenosis 05/12/2015   ? Type 2 diabetes mellitus without complication, without long-term current use of insulin (HCC) 05/12/2015   ? Overweight (BMI 25.0-29.9) 11/11/2014   ? Hyperlipemia 04/26/2009   ? CAD (coronary artery disease) 04/26/2009     10/22/05: Per Dr. Ludwig Lean: Successful percutaneous coronary intervention of the circumflex obtuse marginal bifurcation with a 3.0 x 23 mm Vision bare metal stent extending from the circumflex into the obtuse marginal and a 3.5 x 12 mm Vision bare metal stent in the circumflex at the bifurcation utilizing a Kulot stent technique.     05/20/12: Drug-eluting stent PCI to prox CFX and LAD by Dr. Bufford Buttner           Review of Systems   Constitutional: Negative.   HENT: Negative.    Eyes: Negative.    Cardiovascular: Negative.    Respiratory: Negative.    Endocrine: Negative.    Hematologic/Lymphatic: Negative.    Skin: Positive for skin cancer.   Musculoskeletal: Negative.    Gastrointestinal: Negative.    Genitourinary: Negative.    Neurological: Negative.    Psychiatric/Behavioral: Negative.    Allergic/Immunologic: Negative.        Physical Exam    Physical Exam   General Appearance: no distress   Skin: warm, no ulcers or xanthomas  Digits and Nails: no cyanosis or clubbing   Eyes: conjunctivae and lids normal, pupils are equal and round   Teeth/Gums/Palate: dentition unremarkable, no lesions   Lips & Oral Mucosa: no pallor or cyanosis   Neck Veins: normal JVP , neck veins are not distended   Thyroid: no nodules, masses, tenderness or enlargement   Chest Inspection: chest is normal in appearance   Respiratory Effort: breathing comfortably, no respiratory distress   Auscultation/Percussion: lungs clear to auscultation, no rales or rhonchi, no wheezing   PMI: PMI not enlarged or displaced   Cardiac Rhythm: irregular rhythm and normal rate   Cardiac Auscultation: S1, S2 normal, no rub, no gallop   Murmurs: grade 3 systolic murmur   Peripheral Circulation: normal peripheral circulation   Carotid Arteries: normal carotid upstroke bilaterally, no bruits   Radial Arteries: normal symmetric radial pulses   Abdominal Aorta: no abdominal aortic bruit   Pedal Pulses: normal symmetric pedal pulses   Lower Extremity Edema: no lower extremity edema   Abdominal Exam: soft, non-tender, no masses, bowel sounds normal   Liver & Spleen: no organomegaly   Gait & Station: walks without assistance   Muscle Strength: normal muscle tone   Orientation: oriented to time, place and person   Affect & Mood: appropriate and sustained affect   Language and Memory: patient responsive and seems to comprehend information   Neurologic Exam: neurological assessment grossly intact   Other: moves all extremities      Cardiovascular Studies    EKG:  AF, rate 90.    Cardiovascular Health Factors  Vitals BP Readings from Last 3 Encounters:   01/18/22 108/76   01/08/22 110/72   12/02/20 122/69     Wt Readings from Last 3 Encounters:   01/18/22 95.9 kg (211 lb 6.4 oz)   01/08/22 97 kg (213 lb 13.5 oz)   12/02/20 98 kg (216 lb)     BMI Readings from Last 3 Encounters:   01/18/22 29.48 kg/m?   01/08/22 29.83 kg/m?   12/02/20 30.13 kg/m?      Smoking Social History     Tobacco Use   Smoking Status Former   ? Packs/day: 2.00   ? Years: 5.00   ? Pack years: 10.00   ? Types: Cigarettes   ? Quit date: 02/15/2006   ? Years since quitting: 15.9   Smokeless Tobacco Never      Lipid Profile Cholesterol   Date Value Ref Range Status   12/11/2021 181  Final     HDL   Date Value Ref Range Status   12/11/2021 61  Final     LDL   Date Value Ref Range Status   12/11/2021 108 (H) <100 Final     Triglycerides   Date Value Ref Range Status   12/11/2021 63  Final      Blood Sugar Hemoglobin A1C   Date Value Ref Range Status   07/21/2020 7.5 (H) 4.5 - 6.5 Final     Glucose   Date Value Ref Range Status   12/11/2021 187 (H) 70 - 105 Final   03/29/2018 156 (H) 70 - 100 MG/DL Final   16/06/9603 540 (H) 70 - 100 MG/DL Final   98/07/9146 829 (H) 70 - 110 MG/DL Final   56/21/3086 578 (H) 70 - 110 MG/DL Final     Glucose, POC   Date Value Ref Range Status   03/28/2018 179 (H) 70 - 100 MG/DL Final   46/96/2952 841 (H) 70 -  100 MG/DL Final   47/82/9562 130 (H) 70 - 100 MG/DL Final          Problems Addressed Today  Encounter Diagnoses   Name Primary?   ? Cardiovascular symptoms Yes   ? Persistent atrial fibrillation (HCC)    ? Coronary artery disease involving native coronary artery of native heart without angina pectoris    ? Nonrheumatic aortic valve stenosis    ? Pure hypercholesterolemia    ? Essential hypertension        Assessment and Plan       Persistent atrial fibrillation (HCC)  His atrial fibrillation is entirely asymptomatic so its difficult to determine the duration.  He remains in atrial fibrillation on the EKG today.  He wore a cardiac monitor for 5 days that showed 100% AF.  He is more fatigued and has somewhat lower exercise tolerance lately.  He has been on oral anticoagulation for over 3 weeks at this point and it is reasonable to schedule cardioversion a week from now when I am back in [place].    I will probably add sotalol after he is back in sinus rhythm in order to make an effort to keep him in a normal rhythm.  We will need to continue Eliquis indefinitely.    CAD (coronary artery disease)  No symptoms to suggest any progression of coronary disease.    Nonrheumatic aortic valve stenosis  He had an echocardiogram on April 24 showing evidence of moderate aortic stenosis and normal LV systolic function.  He has moderate concentric left ventricular hypertrophy.  The left atrium was described as being severely dilated.    Hyperlipemia  Lab Results   Component Value Date    CHOL 181 12/11/2021    TRIG 63 12/11/2021    HDL 61 12/11/2021    LDL 108 (H) 12/11/2021    VLDL 13 12/11/2021    NONHDLCHOL 115 05/20/2012    CHOLHDLC 3 12/11/2021      LDL goal should be < 70, given his CAD and diabetes.    Essential hypertension  BP goal is < 130/80.      Current Medications (including today's revisions)  ? acetaminophen (TYLENOL) 325 mg tablet Take two tablets by mouth every 4 hours as needed.   ? amoxicillin(+) (AMOXIL) 500 mg tablet 2g 1hr prior to procedure; 1gm 6hr after procedure (Patient taking differently: as Needed (prior to dental procedures).)   ? apixaban (ELIQUIS) 5 mg tablet Take one tablet by mouth twice daily.   ? atorvastatin (LIPITOR) 40 mg tablet Take 1 tablet by mouth once daily   ? calcium carbonate/vitamin D-3 (OSCAL-500+D) 1250 mg/200 unit tablet Take one tablet by mouth twice daily. Calcium Carb 1250mg  delivers 500mg  elemental Ca   ? empagliflozin (JARDIANCE) 25 mg tablet Take one tablet by mouth every morning.   ? metoprolol (LOPRESSOR) 25 mg tablet Take 0.5 Tabs by mouth twice daily. The dose is 12.5 mg (half tablet) po BID.   ? nitroglycerin (NITROSTAT) 0.4 mg tablet Take one sublingual nitro Q 5 minX three for chest pain   ? semaglutide (RYBELSUS) 3 mg tablet Take one tablet by mouth daily. Administer on an empty stomach, at least 30 minutes before the first food, beverage, or other oral medications of the day; take only with a sip of plain water.   ? tamsulosin (FLOMAX) 0.4 mg capsule Take one capsule by mouth daily.     Total time spent on today's office visit was 45 minutes.  This  includes face-to-face in person visit with patient as well as nonface-to-face time including review of the EMR, outside records, labs, radiologic studies, echocardiogram & other cardiovascular studies, formation of treatment plan, after visit summary, future disposition, and lastly on documentation.

## 2022-01-18 NOTE — Assessment & Plan Note
No symptoms to suggest any progression of coronary disease.

## 2022-01-18 NOTE — Assessment & Plan Note
He had an echocardiogram on April 24 showing evidence of moderate aortic stenosis and normal LV systolic function.  He has moderate concentric left ventricular hypertrophy.  The left atrium was described as being severely dilated.

## 2022-01-18 NOTE — Assessment & Plan Note
Lab Results   Component Value Date    CHOL 181 12/11/2021    TRIG 63 12/11/2021    HDL 61 12/11/2021    LDL 108 (H) 12/11/2021    VLDL 13 12/11/2021    NONHDLCHOL 115 05/20/2012    CHOLHDLC 3 12/11/2021      LDL goal should be < 70, given his CAD and diabetes.

## 2022-01-18 NOTE — Assessment & Plan Note
BP goal is < 130/80.

## 2022-01-23 ENCOUNTER — Encounter: Admit: 2022-01-23 | Discharge: 2022-01-23 | Payer: MEDICARE

## 2022-01-25 ENCOUNTER — Encounter: Admit: 2022-01-25 | Discharge: 2022-01-25 | Payer: MEDICARE

## 2022-01-25 DIAGNOSIS — E7800 Pure hypercholesterolemia, unspecified: Secondary | ICD-10-CM

## 2022-01-25 DIAGNOSIS — R0989 Other specified symptoms and signs involving the circulatory and respiratory systems: Secondary | ICD-10-CM

## 2022-01-25 DIAGNOSIS — I4819 Other persistent atrial fibrillation: Secondary | ICD-10-CM

## 2022-01-25 DIAGNOSIS — I10 Essential (primary) hypertension: Secondary | ICD-10-CM

## 2022-01-25 DIAGNOSIS — I350 Nonrheumatic aortic (valve) stenosis: Secondary | ICD-10-CM

## 2022-01-25 DIAGNOSIS — I2510 Atherosclerotic heart disease of native coronary artery without angina pectoris: Secondary | ICD-10-CM

## 2022-01-25 LAB — BASIC METABOLIC PANEL
ANION GAP: 9
BLD UREA NITROGEN: 19
CALCIUM: 9.5
CHLORIDE: 105
CO2: 24
CREATININE: 0.9
GFR ESTIMATED: 81
GLUCOSE,PANEL: 139 — ABNORMAL HIGH (ref 70–105)
POTASSIUM: 4.3
SODIUM: 138

## 2022-01-25 LAB — CBC
HEMATOCRIT: 47
HEMOGLOBIN: 15
RBC COUNT: 5.1
WBC COUNT: 5.4

## 2022-01-25 MED ORDER — SOTALOL AF 80 MG TABLET
80 mg | ORAL_TABLET | Freq: Two times a day (BID) | ORAL | 3 refills | 30.00000 days | Status: AC
Start: 2022-01-25 — End: ?

## 2022-01-30 ENCOUNTER — Encounter: Admit: 2022-01-30 | Discharge: 2022-01-30 | Payer: MEDICARE

## 2022-01-30 DIAGNOSIS — I4819 Other persistent atrial fibrillation: Secondary | ICD-10-CM

## 2022-01-30 DIAGNOSIS — I2510 Atherosclerotic heart disease of native coronary artery without angina pectoris: Secondary | ICD-10-CM

## 2022-01-30 DIAGNOSIS — I10 Essential (primary) hypertension: Secondary | ICD-10-CM

## 2022-01-30 NOTE — Progress Notes
Patient here for EKG after cardioversion sotalol start.  QTc 476/471 VVS.  Patient states he is doing great.  Routed to [facility]

## 2022-04-23 ENCOUNTER — Encounter: Admit: 2022-04-23 | Discharge: 2022-04-23 | Payer: MEDICARE

## 2022-04-26 ENCOUNTER — Encounter: Admit: 2022-04-26 | Discharge: 2022-04-26 | Payer: MEDICARE

## 2022-04-26 DIAGNOSIS — Z955 Presence of coronary angioplasty implant and graft: Secondary | ICD-10-CM

## 2022-04-26 DIAGNOSIS — I219 Acute myocardial infarction, unspecified: Secondary | ICD-10-CM

## 2022-04-26 DIAGNOSIS — I480 Paroxysmal atrial fibrillation: Secondary | ICD-10-CM

## 2022-04-26 DIAGNOSIS — R011 Cardiac murmur, unspecified: Secondary | ICD-10-CM

## 2022-04-26 DIAGNOSIS — E785 Hyperlipidemia, unspecified: Secondary | ICD-10-CM

## 2022-04-26 DIAGNOSIS — E119 Type 2 diabetes mellitus without complications: Secondary | ICD-10-CM

## 2022-04-26 DIAGNOSIS — I2510 Atherosclerotic heart disease of native coronary artery without angina pectoris: Secondary | ICD-10-CM

## 2022-04-26 DIAGNOSIS — I350 Nonrheumatic aortic (valve) stenosis: Secondary | ICD-10-CM

## 2022-04-26 DIAGNOSIS — I4891 Unspecified atrial fibrillation: Secondary | ICD-10-CM

## 2022-04-26 DIAGNOSIS — R0989 Other specified symptoms and signs involving the circulatory and respiratory systems: Secondary | ICD-10-CM

## 2022-04-26 DIAGNOSIS — T148XXA Other injury of unspecified body region, initial encounter: Secondary | ICD-10-CM

## 2022-04-26 DIAGNOSIS — I10 Essential (primary) hypertension: Secondary | ICD-10-CM

## 2022-04-26 DIAGNOSIS — C4490 Unspecified malignant neoplasm of skin, unspecified: Secondary | ICD-10-CM

## 2022-04-26 NOTE — Assessment & Plan Note
His EKG today shows sinus rhythm.  The QT interval looks fine on sotalol.  I am going to discontinue the metoprolol because I think is a little bit redundant while he is on sotalol.  I will plan to see him back in April.

## 2022-04-26 NOTE — Patient Instructions
Stop metoprolol

## 2022-04-26 NOTE — Progress Notes
Date of Service: 04/26/2022    Darius Cherry is a 71 y.o. male.       HPI     Darius Cherry was in the [place] clinic today for follow-up regarding coronary disease, aortic stenosis, and paroxysmal atrial fibrillation.  His atrial fibrillation was diagnosed earlier this year and was an incidental finding during a routine office visit.  He had absolutely no symptoms.  He underwent cardioversion in April and we started him on sotalol.  He has been on Eliquis all along and has not had any bleeding complications.    He has not had any problems with palpitations or lightheadedness.  He denies any exertional chest discomfort or breathlessness.         Vitals:    04/26/22 0955   BP: 112/78   BP Source: Arm, Left Upper   Pulse: 55   SpO2: 94%   O2 Device: None (Room air)   PainSc: Zero   Weight: 95.7 kg (211 lb)   Height: 180.3 cm (5' 11)     Body mass index is 29.43 kg/m?Marland Kitchen     Past Medical History  Patient Active Problem List    Diagnosis Date Noted   ? Paroxysmal atrial fibrillation (HCC) 01/18/2022     12/18/2021 - Incidental finding of AF during routine physical.  Eliquis started.     ? Pain from implanted hardware 03/28/2018   ? Left ankle pain 10/08/2016     Added automatically from request for surgery [id number]     ? 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 Essential hypertension 05/12/2015   ? Nonrheumatic aortic valve stenosis 05/12/2015   ? Type 2 diabetes mellitus without complication, without long-term current use of insulin (HCC) 05/12/2015   ? Overweight (BMI 25.0-29.9) 11/11/2014   ? Hyperlipemia 04/26/2009   ? CAD (coronary artery disease) 04/26/2009     10/22/05: Per Dr. Ludwig Lean: Successful percutaneous coronary intervention of the circumflex obtuse marginal bifurcation with a 3.0 x 23 mm Vision bare metal stent extending from the circumflex into the obtuse marginal and a 3.5 x 12 mm Vision bare metal stent in the circumflex at the bifurcation utilizing a Kulot stent technique.     05/20/12: Drug-eluting stent PCI to prox CFX and LAD by Dr. Bufford Buttner           Review of Systems   Constitutional: Negative.   HENT: Negative.    Eyes: Negative.    Cardiovascular: Negative.    Respiratory: Negative.    Endocrine: Negative.    Hematologic/Lymphatic: Negative.    Skin: Negative.    Musculoskeletal: Negative.    Gastrointestinal: Negative.    Genitourinary: Negative.    Neurological: Negative.    Psychiatric/Behavioral: Negative.    Allergic/Immunologic: Negative.        Physical Exam    Physical Exam   General Appearance: no distress   Skin: warm, no ulcers or xanthomas   Digits and Nails: no cyanosis or clubbing   Eyes: conjunctivae and lids normal, pupils are equal and round   Teeth/Gums/Palate: dentition unremarkable, no lesions   Lips & Oral Mucosa: no pallor or cyanosis   Neck Veins: normal JVP , neck veins are not distended   Thyroid: no nodules, masses, tenderness or enlargement   Chest Inspection: chest is normal in appearance   Respiratory Effort: breathing comfortably, no respiratory distress   Auscultation/Percussion: lungs clear to auscultation, no rales or rhonchi, no wheezing   PMI: PMI not enlarged or displaced  Cardiac Rhythm: regular rhythm and normal rate   Cardiac Auscultation: S1, S2 normal, no rub, no gallop   Murmurs: grade 3 systolic murmur   Peripheral Circulation: normal peripheral circulation   Carotid Arteries: normal carotid upstroke bilaterally, no bruits   Radial Arteries: normal symmetric radial pulses   Abdominal Aorta: no abdominal aortic bruit   Pedal Pulses: normal symmetric pedal pulses   Lower Extremity Edema: no lower extremity edema   Abdominal Exam: soft, non-tender, no masses, bowel sounds normal   Liver & Spleen: no organomegaly   Gait & Station: walks without assistance   Muscle Strength: normal muscle tone   Orientation: oriented to time, place and person   Affect & Mood: appropriate and sustained affect   Language and Memory: patient responsive and seems to comprehend information Neurologic Exam: neurological assessment grossly intact   Other: moves all extremities      Cardiovascular Studies    EKG:  SR, rate 53.  QTc 444 msec.    Cardiovascular Health Factors  Vitals BP Readings from Last 3 Encounters:   04/26/22 112/78   01/30/22 120/66   01/18/22 108/76     Wt Readings from Last 3 Encounters:   04/26/22 95.7 kg (211 lb)   01/18/22 95.9 kg (211 lb 6.4 oz)   01/08/22 97 kg (213 lb 13.5 oz)     BMI Readings from Last 3 Encounters:   04/26/22 29.43 kg/m?   01/18/22 29.48 kg/m?   01/08/22 29.83 kg/m?      Smoking Social History     Tobacco Use   Smoking Status Former   ? Packs/day: 2.00   ? Years: 5.00   ? Pack years: 10.00   ? Types: Cigarettes   ? Quit date: 02/15/2006   ? Years since quitting: 16.2   Smokeless Tobacco Never      Lipid Profile Cholesterol   Date Value Ref Range Status   12/11/2021 181  Final     HDL   Date Value Ref Range Status   12/11/2021 61  Final     LDL   Date Value Ref Range Status   12/11/2021 108 (H) <100 Final     Triglycerides   Date Value Ref Range Status   12/11/2021 63  Final      Blood Sugar Hemoglobin A1C   Date Value Ref Range Status   04/23/2022 9.5 (H) 4.5 - 6.5 Final     Glucose   Date Value Ref Range Status   01/25/2022 139 (H) 70 - 105 Final   12/11/2021 187 (H) 70 - 105 Final   03/29/2018 156 (H) 70 - 100 MG/DL Final   45/40/9811 914 (H) 70 - 110 MG/DL Final   78/29/5621 308 (H) 70 - 110 MG/DL Final     Glucose, POC   Date Value Ref Range Status   03/28/2018 179 (H) 70 - 100 MG/DL Final   65/78/4696 295 (H) 70 - 100 MG/DL Final   28/41/3244 010 (H) 70 - 100 MG/DL Final          Problems Addressed Today  Encounter Diagnoses   Name Primary?   ? Cardiovascular symptoms Yes   ? Nonrheumatic aortic valve stenosis    ? Coronary artery disease involving native coronary artery of native heart without angina pectoris    ? Paroxysmal atrial fibrillation (HCC)        Assessment and Plan       CAD (coronary artery disease)  His last stress test was  in late 2019. He is not having any angina symptoms and kind of held off on surveillance stress testing because I keep expecting him to need aortic valve replacement, which would be preceded by coronary arteriography.  I plan to see him back in April and we will repeat an echocardiogram at that point.    Paroxysmal atrial fibrillation (HCC)  His EKG today shows sinus rhythm.  The QT interval looks fine on sotalol.  I am going to discontinue the metoprolol because I think is a little bit redundant while he is on sotalol.  I will plan to see him back in April.      Current Medications (including today's revisions)  ? acetaminophen (TYLENOL) 325 mg tablet Take two tablets by mouth every 4 hours as needed.   ? amoxicillin(+) (AMOXIL) 500 mg tablet 2g 1hr prior to procedure; 1gm 6hr after procedure (Patient taking differently: as Needed (prior to dental procedures).)   ? apixaban (ELIQUIS) 5 mg tablet Take one tablet by mouth twice daily.   ? atorvastatin (LIPITOR) 40 mg tablet Take 1 tablet by mouth once daily   ? calcium carbonate/vitamin D-3 (OSCAL-500+D) 1250 mg/200 unit tablet Take one tablet by mouth twice daily. Calcium Carb 1250mg  delivers 500mg  elemental Ca   ? empagliflozin (JARDIANCE) 25 mg tablet Take one tablet by mouth every morning.   ? metFORMIN-XR (GLUCOPHAGE XR) 500 mg extended release tablet Take one tablet by mouth daily.   ? nitroglycerin (NITROSTAT) 0.4 mg tablet Take one sublingual nitro Q 5 minX three for chest pain   ? semaglutide (RYBELSUS) 3 mg tablet Take one tablet by mouth daily. Administer on an empty stomach, at least 30 minutes before the first food, beverage, or other oral medications of the day; take only with a sip of plain water.   ? sotalol AF (BETAPACE AF) 80 mg tab Take one tablet by mouth twice daily.   ? tamsulosin (FLOMAX) 0.4 mg capsule Take one capsule by mouth daily.     Total time spent on today's office visit was 30 minutes.  This includes face-to-face in person visit with patient as well as nonface-to-face time including review of the EMR, outside records, labs, radiologic studies, echocardiogram & other cardiovascular studies, formation of treatment plan, after visit summary, future disposition, and lastly on documentation.

## 2022-04-26 NOTE — Assessment & Plan Note
His last stress test was in late 2019.  He is not having any angina symptoms and kind of held off on surveillance stress testing because I keep expecting him to need aortic valve replacement, which would be preceded by coronary arteriography.  I plan to see him back in April and we will repeat an echocardiogram at that point.

## 2022-05-10 ENCOUNTER — Encounter: Admit: 2022-05-10 | Discharge: 2022-05-10 | Payer: MEDICARE

## 2022-05-10 MED ORDER — SOTALOL AF 80 MG PO TAB
80 mg | ORAL_TABLET | Freq: Two times a day (BID) | ORAL | 0 refills
Start: 2022-05-10 — End: ?

## 2022-08-15 ENCOUNTER — Encounter: Admit: 2022-08-15 | Discharge: 2022-08-15 | Payer: MEDICARE

## 2022-08-15 MED ORDER — ATORVASTATIN 40 MG PO TAB
ORAL_TABLET | 1 refills | Status: AC
Start: 2022-08-15 — End: ?

## 2022-10-10 ENCOUNTER — Encounter: Admit: 2022-10-10 | Discharge: 2022-10-10 | Payer: MEDICARE

## 2023-02-03 ENCOUNTER — Encounter: Admit: 2023-02-03 | Discharge: 2023-02-03 | Payer: MEDICARE

## 2023-02-03 MED ORDER — ATORVASTATIN 40 MG PO TAB
ORAL_TABLET | 0 refills
Start: 2023-02-03 — End: ?

## 2023-02-04 ENCOUNTER — Encounter: Admit: 2023-02-04 | Discharge: 2023-02-04 | Payer: MEDICARE

## 2023-04-17 ENCOUNTER — Encounter: Admit: 2023-04-17 | Discharge: 2023-04-17 | Payer: MEDICARE

## 2023-04-17 NOTE — Assessment & Plan Note
Lab Results   Component Value Date    CHOL 181 12/11/2021    TRIG 63 12/11/2021    HDL 61 12/11/2021    LDL 108 (H) 12/11/2021    VLDL 13 12/11/2021    NONHDLCHOL 115 05/20/2012    CHOLHDLC 3 12/11/2021      LDL goal is < 70.  Atorva 40/day.

## 2023-04-17 NOTE — Assessment & Plan Note
Last stress test was in 2019 and was normal.  He'll need coronary arteriography as part of the work-up for AVR.

## 2023-04-18 ENCOUNTER — Encounter: Admit: 2023-04-18 | Discharge: 2023-04-18 | Payer: MEDICARE

## 2023-04-18 DIAGNOSIS — I350 Nonrheumatic aortic (valve) stenosis: Secondary | ICD-10-CM

## 2023-04-18 DIAGNOSIS — I2510 Atherosclerotic heart disease of native coronary artery without angina pectoris: Secondary | ICD-10-CM

## 2023-04-18 DIAGNOSIS — C4490 Unspecified malignant neoplasm of skin, unspecified: Secondary | ICD-10-CM

## 2023-04-18 DIAGNOSIS — Z955 Presence of coronary angioplasty implant and graft: Secondary | ICD-10-CM

## 2023-04-18 DIAGNOSIS — E119 Type 2 diabetes mellitus without complications: Secondary | ICD-10-CM

## 2023-04-18 DIAGNOSIS — I4891 Unspecified atrial fibrillation: Secondary | ICD-10-CM

## 2023-04-18 DIAGNOSIS — E7800 Pure hypercholesterolemia, unspecified: Secondary | ICD-10-CM

## 2023-04-18 DIAGNOSIS — R011 Cardiac murmur, unspecified: Secondary | ICD-10-CM

## 2023-04-18 DIAGNOSIS — E785 Hyperlipidemia, unspecified: Secondary | ICD-10-CM

## 2023-04-18 DIAGNOSIS — R0989 Other specified symptoms and signs involving the circulatory and respiratory systems: Secondary | ICD-10-CM

## 2023-04-18 DIAGNOSIS — T148XXA Other injury of unspecified body region, initial encounter: Secondary | ICD-10-CM

## 2023-04-18 DIAGNOSIS — I219 Acute myocardial infarction, unspecified: Secondary | ICD-10-CM

## 2023-04-18 DIAGNOSIS — I480 Paroxysmal atrial fibrillation: Secondary | ICD-10-CM

## 2023-04-18 DIAGNOSIS — I10 Essential (primary) hypertension: Secondary | ICD-10-CM

## 2023-04-18 NOTE — Progress Notes
Date of Service: 04/18/2023    Darius Cherry is a 72 y.o. male.       HPI     Darius Cherry was in the [facility] today for follow-up regarding coronary disease, aortic stenosis, and paroxysmal atrial fibrillation.  His atrial fibrillation was diagnosed last year and was an incidental finding during a routine office visit.  He had absolutely no symptoms.  He underwent cardioversion in April, 2023, and we started him on sotalol.  He has been on Eliquis all along and has not had any bleeding complications.    He had coronary stent procedures in 2007 and again in 2013.      He has aortic stenosis that we've followed with yearly echocardiograms.  His recent echocardiogram now shows definite, severe aortic stenosis.     He's a very active guy who is pretty stoic by nature.  When pressed he will admit that he gets a little more winded and doesn't have as much endurance as he did a year or two ago.  This is probably all the symptoms we're going to get out of him!         Vitals:    04/18/23 1420   BP: 122/69   BP Source: Arm, Left Upper   Pulse: 64   SpO2: 96%   O2 Device: None (Room air)   PainSc: Zero   Weight: 94.2 kg (207 lb 9.6 oz)   Height: 180.3 cm (5' 11)     Body mass index is 28.95 kg/m?Marland Kitchen     Past Medical History  Patient Active Problem List    Diagnosis Date Noted    Paroxysmal atrial fibrillation [facility]) 01/18/2022     12/18/2021 - Incidental finding of AF during routine physical.  Eliquis started.      Pain from implanted hardware 03/28/2018    Left ankle pain 10/08/2016     Added automatically from request for surgery [id number]      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    Essential hypertension 05/12/2015    Nonrheumatic aortic valve stenosis 05/12/2015    Type 2 diabetes mellitus without complication, without long-term current use of insulin (HCC) 05/12/2015    Overweight (BMI 25.0-29.9) 11/11/2014    Hyperlipemia 04/26/2009    CAD (coronary artery disease) 04/26/2009     10/22/05: Per Dr. Ludwig Lean: Successful percutaneous coronary intervention of the circumflex obtuse marginal bifurcation with a 3.0 x 23 mm Vision bare metal stent extending from the circumflex into the obtuse marginal and a 3.5 x 12 mm Vision bare metal stent in the circumflex at the bifurcation utilizing a Kulot stent technique.     05/20/12: Drug-eluting stent PCI to prox CFX and LAD by Dr. Bufford Buttner           Review of Systems   Constitutional: Negative.   HENT: Negative.     Eyes: Negative.    Cardiovascular: Negative.    Respiratory: Negative.     Endocrine: Negative.    Hematologic/Lymphatic: Negative.    Skin: Negative.    Musculoskeletal: Negative.    Gastrointestinal: Negative.    Genitourinary: Negative.    Neurological: Negative.    Psychiatric/Behavioral: Negative.     Allergic/Immunologic: Negative.        Physical Exam    Physical Exam   General Appearance: no distress   Skin: warm, no ulcers or xanthomas   Digits and Nails: no cyanosis or clubbing   Eyes: conjunctivae and lids normal, pupils are equal and round  Teeth/Gums/Palate: dentition unremarkable, no lesions   Lips & Oral Mucosa: no pallor or cyanosis   Neck Veins: normal JVP , neck veins are not distended   Thyroid: no nodules, masses, tenderness or enlargement   Chest Inspection: chest is normal in appearance   Respiratory Effort: breathing comfortably, no respiratory distress   Auscultation/Percussion: lungs clear to auscultation, no rales or rhonchi, no wheezing   PMI: PMI not enlarged or displaced   Cardiac Rhythm: regular rhythm and normal rate   Cardiac Auscultation: S1, S2 normal, no rub, no gallop   Murmurs: grade 3 systolic murmur   Peripheral Circulation: normal peripheral circulation   Carotid Arteries: normal carotid upstroke bilaterally, no bruits   Radial Arteries: normal symmetric radial pulses   Abdominal Aorta: no abdominal aortic bruit   Pedal Pulses: normal symmetric pedal pulses   Lower Extremity Edema: no lower extremity edema   Abdominal Exam: soft, non-tender, no masses, bowel sounds normal   Liver & Spleen: no organomegaly   Gait & Station: walks without assistance   Muscle Strength: normal muscle tone   Orientation: oriented to time, place and person   Affect & Mood: appropriate and sustained affect   Language and Memory: patient responsive and seems to comprehend information   Neurologic Exam: neurological assessment grossly intact   Other: moves all extremities      Cardiovascular Studies    EKG:  SR, rate 59.  QTc 437 msec    Cardiovascular Health Factors  Vitals BP Readings from Last 3 Encounters:   04/18/23 122/69   04/17/23 133/76   04/26/22 112/78     Wt Readings from Last 3 Encounters:   04/18/23 94.2 kg (207 lb 9.6 oz)   04/17/23 94.3 kg (208 lb)   04/26/22 95.7 kg (211 lb)     BMI Readings from Last 3 Encounters:   04/18/23 28.95 kg/m?   04/17/23 29.02 kg/m?   04/26/22 29.43 kg/m?      Smoking Social History     Tobacco Use   Smoking Status Former    Current packs/day: 0.00    Average packs/day: 2.0 packs/day for 5.0 years (10.0 ttl pk-yrs)    Types: Cigarettes    Start date: 02/15/2001    Quit date: 02/15/2006    Years since quitting: 17.1   Smokeless Tobacco Never      Lipid Profile Cholesterol   Date Value Ref Range Status   12/11/2021 181  Final     HDL   Date Value Ref Range Status   12/11/2021 61  Final     LDL   Date Value Ref Range Status   12/11/2021 108 (H) <100 Final     Triglycerides   Date Value Ref Range Status   12/11/2021 63  Final      Blood Sugar Hemoglobin A1C   Date Value Ref Range Status   03/06/2023 8.5  Final     Glucose   Date Value Ref Range Status   01/25/2022 139 (H) 70 - 105 Final   12/11/2021 187 (H) 70 - 105 Final   03/29/2018 156 (H) 70 - 100 MG/DL Final   09/81/1914 782 (H) 70 - 110 MG/DL Final   95/62/1308 657 (H) 70 - 110 MG/DL Final     Glucose, POC   Date Value Ref Range Status   03/28/2018 179 (H) 70 - 100 MG/DL Final   84/69/6295 284 (H) 70 - 100 MG/DL Final   13/24/4010 272 (H) 70 - 100 MG/DL Final  Problems Addressed Today  Encounter Diagnoses   Name Primary?    Nonrheumatic aortic valve stenosis Yes    Pure hypercholesterolemia     Coronary artery disease involving native coronary artery of native heart without angina pectoris     Cardiovascular symptoms     Paroxysmal atrial fibrillation (HCC)        Assessment and Plan       Nonrheumatic aortic valve stenosis  Yesterday's echocardiogram suggests severe aortic stenosis combined with moderate regurgitation.  LV size has increased compared to the last echo a little over a year ago.    We talked today about TAVR and he's willing to schedule an appointment in the [facility].    Hyperlipemia  Lab Results   Component Value Date    CHOL 181 12/11/2021    TRIG 63 12/11/2021    HDL 61 12/11/2021    LDL 108 (H) 12/11/2021    VLDL 13 12/11/2021    NONHDLCHOL 115 05/20/2012    CHOLHDLC 3 12/11/2021      LDL goal is < 70.  Atorva 40/day.    CAD (coronary artery disease)  Last stress test was in 2019 and was normal.  He'll need coronary arteriography as part of the work-up for AVR.    Current Medications (including today's revisions)   acetaminophen (TYLENOL) 325 mg tablet Take two tablets by mouth every 4 hours as needed.    amoxicillin(+) (AMOXIL) 500 mg tablet 2g 1hr prior to procedure; 1gm 6hr after procedure (Patient taking differently: as Needed (prior to dental procedures).)    apixaban (ELIQUIS) 5 mg tablet Take one tablet by mouth twice daily.    atorvastatin (LIPITOR) 40 mg tablet Take 1 tablet by mouth once daily    calcium carbonate/vitamin D-3 (OSCAL-500+D) 1250 mg/200 unit tablet Take one tablet by mouth twice daily. Calcium Carb 1250mg  delivers 500mg  elemental Ca    empagliflozin (JARDIANCE) 25 mg tablet Take one tablet by mouth every morning.    metFORMIN-XR (GLUCOPHAGE XR) 500 mg extended release tablet Take one tablet by mouth daily.    nitroglycerin (NITROSTAT) 0.4 mg tablet Take one sublingual nitro Q 5 minX three for chest pain    SOTALOL AF 80 mg tablet Take 1 tablet by mouth twice daily    tamsulosin (FLOMAX) 0.4 mg capsule Take one capsule by mouth daily.     Total time spent on today's office visit was 45 minutes.  This includes face-to-face in person visit with patient as well as nonface-to-face time including review of the EMR, outside records, labs, radiologic studies, echocardiogram & other cardiovascular studies, formation of treatment plan, after visit summary, future disposition, and lastly on documentation.

## 2023-04-19 ENCOUNTER — Encounter: Admit: 2023-04-19 | Discharge: 2023-04-19 | Payer: MEDICARE

## 2023-04-19 DIAGNOSIS — I350 Nonrheumatic aortic (valve) stenosis: Secondary | ICD-10-CM

## 2023-04-19 DIAGNOSIS — Z01818 Encounter for other preprocedural examination: Secondary | ICD-10-CM

## 2023-04-23 ENCOUNTER — Encounter: Admit: 2023-04-23 | Discharge: 2023-04-23 | Payer: MEDICARE

## 2023-04-23 ENCOUNTER — Ambulatory Visit: Admit: 2023-04-23 | Discharge: 2023-04-23 | Payer: MEDICARE

## 2023-04-23 DIAGNOSIS — I2510 Atherosclerotic heart disease of native coronary artery without angina pectoris: Secondary | ICD-10-CM

## 2023-04-23 DIAGNOSIS — I10 Essential (primary) hypertension: Secondary | ICD-10-CM

## 2023-04-23 DIAGNOSIS — Z01810 Encounter for preprocedural cardiovascular examination: Secondary | ICD-10-CM

## 2023-04-23 DIAGNOSIS — C4490 Unspecified malignant neoplasm of skin, unspecified: Secondary | ICD-10-CM

## 2023-04-23 DIAGNOSIS — I480 Paroxysmal atrial fibrillation: Secondary | ICD-10-CM

## 2023-04-23 DIAGNOSIS — Z01818 Encounter for other preprocedural examination: Secondary | ICD-10-CM

## 2023-04-23 DIAGNOSIS — I350 Nonrheumatic aortic (valve) stenosis: Secondary | ICD-10-CM

## 2023-04-23 DIAGNOSIS — E785 Hyperlipidemia, unspecified: Secondary | ICD-10-CM

## 2023-04-23 DIAGNOSIS — R011 Cardiac murmur, unspecified: Secondary | ICD-10-CM

## 2023-04-23 DIAGNOSIS — T148XXA Other injury of unspecified body region, initial encounter: Secondary | ICD-10-CM

## 2023-04-23 DIAGNOSIS — I4891 Unspecified atrial fibrillation: Secondary | ICD-10-CM

## 2023-04-23 DIAGNOSIS — E119 Type 2 diabetes mellitus without complications: Secondary | ICD-10-CM

## 2023-04-23 DIAGNOSIS — Z955 Presence of coronary angioplasty implant and graft: Secondary | ICD-10-CM

## 2023-04-23 DIAGNOSIS — E7800 Pure hypercholesterolemia, unspecified: Secondary | ICD-10-CM

## 2023-04-23 DIAGNOSIS — I219 Acute myocardial infarction, unspecified: Secondary | ICD-10-CM

## 2023-04-23 LAB — POC CREATININE, RAD: CREATININE, POC: 1 mg/dL (ref 0.4–1.24)

## 2023-04-23 MED ORDER — ALUMINUM-MAGNESIUM HYDROXIDE 200-200 MG/5 ML PO SUSP
30 mL | ORAL | 0 refills | PRN
Start: 2023-04-23 — End: ?

## 2023-04-23 MED ORDER — DOCUSATE SODIUM 100 MG PO CAP
100 mg | Freq: Every day | ORAL | 0 refills | PRN
Start: 2023-04-23 — End: ?

## 2023-04-23 MED ORDER — TEMAZEPAM 15 MG PO CAP
15 mg | Freq: Every evening | ORAL | 0 refills | PRN
Start: 2023-04-23 — End: ?

## 2023-04-23 MED ORDER — IOHEXOL 350 MG IODINE/ML IV SOLN
100 mL | Freq: Once | INTRAVENOUS | 0 refills | Status: CP
Start: 2023-04-23 — End: ?
  Administered 2023-04-23: 15:00:00 100 mL via INTRAVENOUS

## 2023-04-23 MED ORDER — SODIUM CHLORIDE 0.9 % IJ SOLN
50 mL | Freq: Once | INTRAVENOUS | 0 refills | Status: CP
Start: 2023-04-23 — End: ?
  Administered 2023-04-23: 15:00:00 50 mL via INTRAVENOUS

## 2023-04-23 MED ORDER — ACETAMINOPHEN 325 MG PO TAB
650 mg | ORAL | 0 refills | PRN
Start: 2023-04-23 — End: ?

## 2023-04-23 MED ORDER — ASPIRIN 325 MG PO TAB
325 mg | Freq: Once | ORAL | 0 refills
Start: 2023-04-23 — End: ?

## 2023-04-23 MED ORDER — NITROGLYCERIN 0.4 MG SL SUBL
.4 mg | SUBLINGUAL | 0 refills | PRN
Start: 2023-04-23 — End: ?

## 2023-04-23 NOTE — Progress Notes
Date of Service: 04/23/2023       Subjective:             Darius Cherry is a 72 y.o. male.      History of Present Illness    We had the pleasure of seeing Darius Cherry who is referred for aortic stenosis.  He is a 72 year old with history of coronary artery disease with PCI to the circumflex in 2007 and to the circumflex and LAD in 2013, paroxysmal A-fib on apixaban, hypertension, hyperlipidemia and type 2 diabetes.  He follows routinely with Dr. Barry Dienes.  He has aortic stenosis that Dr. Barry Dienes has been monitoring.  He had a recent echocardiogram that showed progressive and the aortic stenosis which is now severe.  His EF is 65%.  The aortic valve is heavily calcified.  The velocities 4.5 m/s with a mean gradient of 62 mmHg and valve area 0.72 cm?.  He also has moderate aortic insufficiency.  He was referred to valve clinic to consider intervention.    [name] is very active.  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  He denies any significant limitations.  He states he gets short of breath with very heavy exertion or when it is hot.  He denies any chest pain, palpitations, near-syncope or syncope.  He has some lower extremity edema related to prior ankle surgery.    CTA 8/6:  CHEST:   1. Calcified aortic valve in keeping with aortic stenosis.   2. Densely calcified coronary arteries.   3. Normal caliber thoracic aorta with mild atherosclerosis. Major arch   branch vessels are grossly patent. No subclavian artery stenosis.      ABDOMEN AND PELVIS:   1. Mild to moderate aortoiliac atherosclerosis. 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 Small common iliac   artery aneurysm.   2. No significant stenosis along either access pathway.          Past Medical History:   Diagnosis Date    Aortic valve stenosis     Atrial fibrillation (HCC)     CAD (coronary artery disease) 04/26/2009    DM (diabetes mellitus) (HCC)     Fracture 2016 Left ankle    History of coronary artery stent placement 2007/ 2013    x4    Hyperlipemia 04/26/2009    Hypertension 04/26/2009    Murmur, cardiac     Myocardial infarction [facility]) 2007    x2 stents    Skin cancer     lips, head     Surgical History:   Procedure Laterality Date    ARTHROPLASTY REPLACEMENT TOTAL ANKLE (PROPHECY CASE [phone]) Left 11/05/2016    Performed by Ree Shay, MD at [facility] OR    LENGTHENING LEFT  ACHILLES TENDON Left 11/05/2016    Performed by Ree Shay, MD at [facility] OR    ARTHROPLASTY REVISION TOTAL ANKLE TO IN-BONE (PROPHECY [phone]) Left 03/28/2018    Performed by Ree Shay, MD at [facility] OR    REMOVAL HARDWARE - DEEP - LOWER EXTREMITY  03/28/2018    Performed by Ree Shay, MD at [facility] OR    CORONARY ANGIOPLASTY      HAND SURGERY Left     HX CORONARY STENT PLACEMENT      HX HEART CATHETERIZATION  2007, 05/2012    x4 stents total     Family History   Problem Relation Name  Age of Onset    Diabetes Mother Darius Cherry     Stroke Mother Darius Cherry     Coronary Artery Disease Father Darius Cherry     Diabetes Father Darius Cherry      Social History     Socioeconomic History    Marital status: Married   Tobacco Use    Smoking status: Former     Current packs/day: 0.00     Average packs/day: 2.0 packs/day for 5.0 years (10.0 ttl pk-yrs)     Types: Cigarettes     Start date: 02/15/2001     Quit date: 02/15/2006     Years since quitting: 17.1    Smokeless tobacco: Never   Substance and Sexual Activity    Alcohol use: Yes     Alcohol/week: 2.0 standard drinks of alcohol     Types: 2 Cans of beer per week     Comment: per day    Drug use: No                   Review of Systems   Constitutional: Negative.   HENT: Negative.     Eyes: Negative.    Cardiovascular: Negative.    Respiratory: Negative.     Endocrine: Negative.    Hematologic/Lymphatic: Negative.    Skin: Negative.    Musculoskeletal: Negative.    Gastrointestinal: Negative.    Genitourinary: Negative.    Neurological: Negative.    Psychiatric/Behavioral: Negative. Allergic/Immunologic: Negative.        Objective:          acetaminophen (TYLENOL) 325 mg tablet Take two tablets by mouth every 4 hours as needed.    amoxicillin(+) (AMOXIL) 500 mg tablet 2g 1hr prior to procedure; 1gm 6hr after procedure (Patient taking differently: as Needed (prior to dental procedures).)    apixaban (ELIQUIS) 5 mg tablet Take one tablet by mouth twice daily.    atorvastatin (LIPITOR) 40 mg tablet Take 1 tablet by mouth once daily    calcium carbonate/vitamin D-3 (OSCAL-500+D) 1250 mg/200 unit tablet Take one tablet by mouth twice daily. Calcium Carb 1250mg  delivers 500mg  elemental Ca    carvediloL (COREG) 3.125 mg tablet Take one tablet by mouth twice daily with meals. Take with food.    empagliflozin (JARDIANCE) 25 mg tablet Take one tablet by mouth every morning.    metFORMIN-XR (GLUCOPHAGE XR) 500 mg extended release tablet Take one tablet by mouth twice daily.    nitroglycerin (NITROSTAT) 0.4 mg tablet Take one sublingual nitro Q 5 minX three for chest pain    RYBELSUS 14 mg tablet Take one tablet by mouth daily.    SOTALOL AF 80 mg tablet Take 1 tablet by mouth twice daily    tamsulosin (FLOMAX) 0.4 mg capsule Take one capsule by mouth daily.     Vitals:    04/23/23 1018   BP: 134/70   BP Source: Arm, Left Upper   Pulse: 68   SpO2: 96%   PainSc: Zero   Weight: 91.2 kg (201 lb)   Height: 180.3 cm (5' 11)     Body mass index is 28.03 kg/m?Marland Kitchen     Physical Exam  General Appearance: no acute distress  Skin: warm & intact  HEENT: unremarkable  Neck Veins: neck veins are flat & not distended  Carotid Arteries: no bruits  Chest Inspection: chest is normal in appearance  Auscultation/Percussion: lungs clear to auscultation, no rales, rhonchi, or wheezing  Cardiac Rhythm: regular rhythm & normal rate  Cardiac Auscultation: Normal  S1 & S2, no S3 or S4, no rub  Murmurs: 3/6 systolic murmur   Extremities: no lower extremity edema; 2+ symmetric distal pulses  Abdominal Exam: soft, non-tender, no masses, bowel sounds normal  Liver & Spleen: no organomegaly  Neurologic Exam: oriented to time, place and person; no focal neurologic deficits  Psychiatric: Normal mood and affect.  Behavior is normal. Judgment and thought content normal.          Assessment and Plan:    1.  Aortic stenosis.  Dr. Helen Hashimoto and Dr. Paris Lore reviewed his echocardiogram.  He does have severe aortic stenosis.  He has had a CTA today and his anatomy suitable for TAVR.  We will continue workup.  He needs a cardiac catheterization, PFTs and carotid duplex.  He also needs dental clearance.      2.  Coronary artery disease.  He has had prior PCI to the circumflex and LAD.  He denies anginal symptoms.    3.  Paroxysmal A-fib.  He is on sotalol for rhythm control and apixaban for anticoagulation.    4.  Hypertension.  He is on carvedilol.  His blood pressure is well-controlled.    5.  Type 2 diabetes.    Thank you for allowing [place] to participate in the care of this pleasant individual.  If you have any other questions or concerns, please do not hesitate to contact us.    Sherrlyn Hock, APRN  Structural Heart Nurse Practitioner  Pager [phone]      I had the pleasure of seeing Angelina Hanko in the valve clinic today for evaluation of his severe aortic stenosis.  He is referred by Dr. Edwinna Areola.    His echo shows preserved left ventricular function with a mean gradient of 62, valve area 0.72 cm? and a peak velocity of 4.5 m/s.  Past medical history is notable for previous PCI, paroxysmal A-fib, hypertension and type 2 diabetes.    He has not had a recent cardiac catheterization.  We discussed management of his severe symptomatic aortic stenosis.  He had CTA this morning that shows adequate femoral anatomy as well as annular anatomy for TAVR.  We discussed both open and TAVR options.  Understandably he wishes to pursue the least invasive possible way.  His workup is not complete.  He needs PFTs as well as a dental evaluation.  He also needs a repeat cardiac catheterization.  If his anatomy is appropriate we will plan on pursuing TAVR.  I reviewed the risks of the procedure including bleeding, infection, stroke, dialysis and death.  He expressed understanding and is comfortable with proceeding.  We will keep you updated on his progress.    Sindy Messing, M.D.

## 2023-04-23 NOTE — Patient Instructions
CARDIAC CATHETERIZATION   PRE-ADMISSION INSTRUCTIONS    Patient Name: Darius Cherry  MRN#: [id number]  Date of Birth: 07/25/1951 (72 y.o.)  Today's Date: 04/23/2023    PROCEDURE:  You are scheduled for a Coronary Angiogram with possible Angioplasty/Stenting with Dr. Idamae Lusher.    PROCEDURE DATE AND ARRIVAL TIME:  Your procedure date is Wednesday, May 15, 2023.  You will receive a call from the Cath lab staff between 8:00 a.m. and noon on the business day prior to your procedure to let you know at what time to arrive on the day of your procedure.    Please check in at the Admitting Desk in the [facility] for your procedure.   Address:  [address] [place], [place] [address]    [facility] Entrance and and take a right. Continue down the hallway past the Cardiovascular Medicine office. That hall will take you into the [facility]. Check in at the desk on the left side.)     (If you have further questions regarding your arrival time for the CV lab, please call [phone] by 3:00pm the day before your procedure. Please leave a message with your name and number, your call will be returned in a timely manner.)    PRE-PROCEDURE APPOINTMENTS:    8/6   Office visit to update history and physical (requirement within 30 days of procedure)  with Dr. Idamae Lusher at Cardiovascular Medicine  [facility]       8/14 - 8/23   Pre-Admission lab work required within 14 days of procedure: BMP and CBC at the lab of your choice.         FOOD AND DRINK INSTRUCTIONS  Nothing to eat after midnight before your procedure. No caffeine for 24 hours prior to your procedure. You will be under moderate sedation for your procedure.  You may drink clear liquids up to an hour before hospital arrival. This will be confirmed by the Cath lab staff the day before your procedure.     SPECIAL MEDICATION INSTRUCTIONS  Any new prescriptions will be sent to your pharmacy listed on file with [place].        Please either take 4 baby aspirins (4 times 81mg ) or one full strength NON-COATED 325mg  aspirin the morning of your procedure.    apixaban (Eliquis): hold 1 days and AM of procedure. Last dose on 8/26.     Hypoglycemics: metformin (Glucophage) -- hold the morning of your procedure. , semaglutide (Rybelsus) -- hold the morning of your procedure. , and empagliflozin (Jardiance) -- hold the morning of your procedure.      HOLD ALL erectile dysfunction medications for 3 days, unless prescribed for pulmonary hypertension.     HOLD ALL over the counter vitamins or supplements on the morning of your procedure.      Additional Instructions  If you wear CPAP, please bring your mask and machine with you to the hospital.    Take a bath or shower with anti-bacterial soap the evening before, or the morning of the procedure.     Bring photo ID and your health insurance card(s).    Arrange for a driver to take you home from the hospital. Please arrange for a friend or family member to take you home from this test. You cannot take a Taxi, [employer] Goad, or public transportation as there has to be a responsible person to help care for you after sedation    Bring an accurate list of your current  medications with you to the hospital (all medications and supplements taken daily).  Please use the medication list below and write in the date and time when you took your last dose before your procedure. Update this list of medications as needed.      Wear comfortable clothes and don't bring valuables, other than photo identification card, with you to the hospital.    Please pack a bag for a possible overnight stay.     Please review your pre-procedure instructions and bring them with you on the day of your procedure. Call the valve clinic nurses, [facility], with any questions [phone].        ALLERGIES  Allergies   Allergen Reactions    Celebrex [Celecoxib] RASH and ITCHING       CURRENT MEDICATIONS  Outpatient Encounter Medications as of 04/23/2023   Medication Sig Dispense Refill    acetaminophen (TYLENOL) 325 mg tablet Take two tablets by mouth every 4 hours as needed.  0    amoxicillin(+) (AMOXIL) 500 mg tablet 2g 1hr prior to procedure; 1gm 6hr after procedure (Patient taking differently: as Needed (prior to dental procedures).) 6 tablet 1    apixaban (ELIQUIS) 5 mg tablet Take one tablet by mouth twice daily.      atorvastatin (LIPITOR) 40 mg tablet Take 1 tablet by mouth once daily 90 tablet 3    calcium carbonate/vitamin D-3 (OSCAL-500+D) 1250 mg/200 unit tablet Take one tablet by mouth twice daily. Calcium Carb 1250mg  delivers 500mg  elemental Ca      carvediloL (COREG) 3.125 mg tablet Take one tablet by mouth twice daily with meals. Take with food.      empagliflozin (JARDIANCE) 25 mg tablet Take one tablet by mouth every morning.      metFORMIN-XR (GLUCOPHAGE XR) 500 mg extended release tablet Take one tablet by mouth twice daily.      nitroglycerin (NITROSTAT) 0.4 mg tablet Take one sublingual nitro Q 5 minX three for chest pain 25 Tab 6    RYBELSUS 14 mg tablet Take one tablet by mouth daily.      SOTALOL AF 80 mg tablet Take 1 tablet by mouth twice daily 180 tablet 3    tamsulosin (FLOMAX) 0.4 mg capsule Take one capsule by mouth daily.       No facility-administered encounter medications on file as of 04/23/2023.       _________________________________________  Form completed by: Lauraine Rinne, RN  Date completed: 04/23/23  Method: In person and given to the patient.          Coronary Angiography  Angiography is a special type of moving X-ray that lets your doctor view your coronary arteries to see if the blood vessels to your heart are narrowed or blocked. This test is done when someone is having a heart attack. Or it may be done if symptoms may mean a heart attack. It also may be done after an abnormal cardiac stress test.    Before the procedure   Tell your healthcare team what medicines you take and any allergies you may have.  Tell your healthcare team if you've had a reaction to contrast dye or have had any kidney problems.  Follow any directions you are given for not eating or drinking before surgery.  A nurse will place an IV (intravenous) catheter in your vein to give fluids, and medicine to relieve pain and help you feel less anxious.  They clean your skin and shave the area where the catheter  will be inserted, if needed.    During the procedure   You will lie on a table with a portable X-ray machine over you. The team will place a surgical drape over your body. The area where the doctor chooses to insert the catheter will be cleaned. This will be either a wrist or the groin.  Your doctor will place a long, thin tube (catheter) inside an artery in your groin or arm and guide it into your heart. You may feel pressure with the insertion of the catheter. A numbing medicine often is injected at the insertion site. This eases discomfort during the procedure.  They will inject a contrast dye through the catheter into your blood vessels or heart chambers. You may feel a warm sensation or feeling like you have to urinate when the contrast is injected. This is normal.  X-rays are taken to show images of the inside of your heart and coronary arteries.     The catheter can be placed into the groin, arm, or wrist.     After the procedure   Your healthcare team will tell you how long to lie down and keep the insertion site still. The amount of time may depend on whether a closure device such as a stitch or collagen plug was used to close the opening made in your artery. The time you must be still may be shorter if one of these devices was used. The amount of time will also depend on if there is any bleeding at the catheter insertion site.  If the insertion site was in your groin, you may need to lie down with your leg still for several hours. If the insertion site was in your wrist, a pressure bandage may be put on the site. Or you may have closure device placed on the insertion site. It will be taken off when there is no sign of bleeding. If bleeding occurs, a nurse will put pressure on the area to control it.  A nurse will check your blood pressure and the insertion site often. This is to make sure you remain stable after the procedure.  You may be asked to drink fluid to help flush the contrast liquid out of your system.  Have someone drive you home from the hospital.  If your doctor uses angioplasty or a stent to treat a blocked artery, you may stay the night in the hospital. If there are multiple blockages that can't be fixed with a stent or angioplasty, you may need surgery to bypass the blockages. This is called coronary artery bypass graft surgery. Your doctor will explain the results of your test and what treatment options that may be best for you.  It?s normal to find a small bruise or lump at the insertion site. The lump may be the collagen plug or stitch that you feel, or a small bruise. These common side effects should disappear within a few weeks.  You will be given instructions by your healthcare team on recovering from the coronary angiography. In general, don't lift anything heavier than a gallon of milk for several days. This gives time for the puncture site in the artery wall to heal. Try not to get the puncture site wet. Don't put it under water. Showers are OK. Don't soak in a bathtub, swimming pool, or hot tub until the skin has healed.    When to call your healthcare provider   Call your healthcare provider right away if you have any of these:  Symptoms of infection. These include pain, swelling, redness, bleeding, or drainage at the insertion site.  Fever of 100.4?F (38?C) or higher, or as advised by your provider  Bleeding, bruising, or a lot of swelling where the catheter was inserted  Blood in your urine  Black or tarry stools  Any unusual bleeding  Irregular, very slow, or fast heartbeat  Dizziness  Call 911  Call 911if any of these occur:     Chest pain  Shortness of breath  Sudden numbness or weakness in arms, legs, or face, or difficulty speaking  The puncture site swells up very fast  Bleeding from the puncture site that does not slow down with firm pressure  Severe or increasing pain, numbness, coldness, or a bluish color in the leg or arm that held the catheter    [employer] last reviewed this educational content on 10/18/2020  ? 2000-2024 [employer], [place]. All rights reserved. This information is not intended as a substitute for professional medical care. Always follow your healthcare professional's instructions.

## 2023-04-23 NOTE — Progress Notes
Date of Service: 04/23/2023    Darius Cherry is a 72 y.o. male.       HPI       We had the pleasure of seeing Darius Cherry who is referred for aortic stenosis.  He is a 72 year old with history of coronary artery disease with PCI to the circumflex in 2007 and to the circumflex and LAD in 2013, paroxysmal A-fib on apixaban, hypertension, hyperlipidemia and type 2 diabetes.  He follows routinely with Dr. Barry Dienes.  He has aortic stenosis that Dr. Barry Dienes has been monitoring.  He had a recent echocardiogram that showed progressive and the aortic stenosis which is now severe.  His EF is 65%.  The aortic valve is heavily calcified.  The velocities 4.5 m/s with a mean gradient of 62 mmHg and valve area 0.72 cm?.  He also has moderate aortic insufficiency.  He was referred to valve clinic to consider intervention.     Darius Cherry is very active.  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  He denies any significant limitations.  He states he gets short of breath with very heavy exertion or when it is hot.  He denies any chest pain, palpitations, near-syncope or syncope.  He has some lower extremity edema related to prior ankle surgery.     CTA 8/6:  CHEST:   1. Calcified aortic valve in keeping with aortic stenosis.   2. Densely calcified coronary arteries.   3. Normal caliber thoracic aorta with mild atherosclerosis. Major arch   branch vessels are grossly patent. No subclavian artery stenosis.      ABDOMEN AND PELVIS:   1. Mild to moderate aortoiliac atherosclerosis. 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 Small common iliac   artery aneurysm.   2. No significant stenosis along either access pathway.                Vitals:    04/23/23 1031   BP: 134/70   BP Source: Arm, Left Upper   Pulse: 68   PainSc: Zero   Weight: 91.2 kg (201 lb)   Height: 180.3 cm (5' 11)     Body mass index is 28.03 kg/m?Marland Kitchen     Past Medical History  Patient Active Problem List    Diagnosis Date Noted    Paroxysmal atrial fibrillation [facility]) 01/18/2022     12/18/2021 - Incidental finding of AF during routine physical.  Darius Cherry started.      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 Ludwig Lean: Successful percutaneous coronary intervention of the circumflex obtuse marginal bifurcation with a 3.0 x 23 mm Vision bare metal stent extending from the circumflex into the obtuse marginal and a 3.5 x 12 mm Vision bare metal stent in the circumflex at the bifurcation utilizing a Kulot stent technique.     05/20/12: Drug-eluting stent PCI to prox CFX and LAD by Dr. Bufford Buttner  Review of Systems   Constitutional: Negative.   HENT: Negative.     Eyes: Negative.    Cardiovascular: Negative.    Respiratory: Negative.     Endocrine: Negative.    Hematologic/Lymphatic: Negative.    Skin: Negative.    Musculoskeletal: Negative.    Gastrointestinal: Negative.    Genitourinary: Negative.    Neurological: Negative.    Psychiatric/Behavioral: Negative.     Allergic/Immunologic: Negative.        Physical Exam  General Appearance: no acute distress  Skin: warm & intact  HEENT: unremarkable  Neck Veins: neck veins are flat & not distended  Carotid Arteries: no bruits  Chest Inspection: chest is normal in appearance  Auscultation/Percussion: lungs clear to auscultation, no rales, rhonchi, or wheezing  Cardiac Rhythm: regular rhythm & normal rate  Cardiac Auscultation: Normal S1 & S2, no S3 or S4, no rub  Murmurs: 3/6 systolic murmur   Extremities: no lower extremity edema; 2+ symmetric distal pulses  Abdominal Exam: soft, non-tender, no masses, bowel sounds normal  Liver & Spleen: no organomegaly  Neurologic Exam: oriented to time, place and person; no focal neurologic deficits  Psychiatric: Normal mood and affect.  Behavior is normal. Judgment and thought content normal.     Cardiovascular Studies  Preliminary EKG:    NSR, rate 60 bpm.      Cardiovascular Health Factors  Vitals BP Readings from Last 3 Encounters:   04/23/23 134/70   04/23/23 134/70   04/18/23 122/69     Wt Readings from Last 3 Encounters:   04/23/23 91.2 kg (201 lb)   04/23/23 91.2 kg (201 lb)   04/18/23 94.2 kg (207 lb 9.6 oz)     BMI Readings from Last 3 Encounters:   04/23/23 28.03 kg/m?   04/23/23 28.03 kg/m?   04/18/23 28.95 kg/m?      Smoking Social History     Tobacco Use   Smoking Status Former    Current packs/day: 0.00    Average packs/day: 2.0 packs/day for 5.0 years (10.0 ttl pk-yrs)    Types: Cigarettes    Start date: 02/15/2001    Quit date: 02/15/2006    Years since quitting: 17.1   Smokeless Tobacco Never      Lipid Profile Cholesterol   Date Value Ref Range Status   12/11/2021 181  Final     HDL   Date Value Ref Range Status   12/11/2021 61  Final     LDL   Date Value Ref Range Status   12/11/2021 108 (H) <100 Final     Triglycerides   Date Value Ref Range Status   12/11/2021 63  Final      Blood Sugar Hemoglobin A1C   Date Value Ref Range Status   03/06/2023 8.5  Final     Glucose   Date Value Ref Range Status   01/25/2022 139 (H) 70 - 105 Final   12/11/2021 187 (H) 70 - 105 Final   03/29/2018 156 (H) 70 - 100 MG/DL Final   16/06/9603 540 (H) 70 - 110 MG/DL Final   98/07/9146 829 (H) 70 - 110 MG/DL Final     Glucose, POC   Date Value Ref Range Status   03/28/2018 179 (H) 70 - 100 MG/DL Final   56/21/3086 578 (H) 70 - 100 MG/DL Final   46/96/2952 841 (H) 70 - 100 MG/DL Final          Problems Addressed Today  Encounter Diagnoses   Name  Primary?    Nonrheumatic aortic valve stenosis Yes    Paroxysmal atrial fibrillation (HCC)     Type 2 diabetes mellitus without complication, without long-term current use of insulin (HCC)     Essential hypertension     Pure hypercholesterolemia     Coronary artery disease involving native coronary artery of native heart without angina pectoris        Assessment and Plan     1.  Aortic stenosis.  Dr. Helen Hashimoto and Dr. Paris Lore reviewed his echocardiogram.  He does have severe aortic stenosis.  He has had a CTA today and his anatomy suitable for TAVR.  We will continue workup.  He needs a cardiac catheterization, PFTs and carotid duplex.  He also needs dental clearance.       2.  Coronary artery disease.  He has had prior PCI to the circumflex and LAD.  He denies anginal symptoms.     3.  Paroxysmal A-fib.  He is on sotalol for rhythm control and apixaban for anticoagulation.     4.  Hypertension.  He is on carvedilol.  His blood pressure is well-controlled.     5.  Type 2 diabetes.     Thank you for allowing [place] to participate in the care of this pleasant individual.  If you have any other questions or concerns, please do not hesitate to contact us.     Sherrlyn Hock, APRN  Structural Heart Nurse Practitioner  Pager [phone]        Darius Cherry is a 72 year old male who presents to the clinic today for evaluation of severe symptomatic aortic stenosis.  He has a very active lifestyle and continues to actively farm.  He does not describe overt limitations but has New York Heart Association class II symptoms.  I did have a chance to review his echocardiogram and he does clearly have severe aortic stenosis.  His echocardiogram was performed on July 31 and his overall left ventricular systolic function was preserved at 65%.  He has a mean aortic valve gradient of 62 mmHg with a calculated aortic valve area of 0.72 cm? with associated moderate aortic insufficiency.  I do think there is a clear indication for aortic valve replacement and he is likely going to be a good candidate for transcatheter-based approach.  He does have a prior history of coronary artery disease and had stents placed to his circumflex and left anterior descending artery.  We will plan to proceed with the remaining workup for aortic valve replacement including a repeat cardiac catheterization.  Will keep you informed as to his progress and appreciate being involved in his care.  Laney Pastor, MD      Current Medications (including today's revisions)   acetaminophen (TYLENOL) 325 mg tablet Take two tablets by mouth every 4 hours as needed.    amoxicillin(+) (AMOXIL) 500 mg tablet 2g 1hr prior to procedure; 1gm 6hr after procedure (Patient taking differently: as Needed (prior to dental procedures).)    apixaban (Darius Cherry) 5 mg tablet Take one tablet by mouth twice daily.    atorvastatin (LIPITOR) 40 mg tablet Take 1 tablet by mouth once daily    calcium carbonate/vitamin D-3 (OSCAL-500+D) 1250 mg/200 unit tablet Take one tablet by mouth twice daily. Calcium Carb 1250mg  delivers 500mg  elemental Ca    carvediloL (COREG) 3.125 mg tablet Take one tablet by mouth twice daily with meals. Take with food.    empagliflozin (JARDIANCE) 25 mg tablet Take one tablet by mouth every  morning.    metFORMIN-XR (GLUCOPHAGE XR) 500 mg extended release tablet Take one tablet by mouth twice daily.    nitroglycerin (NITROSTAT) 0.4 mg tablet Take one sublingual nitro Q 5 minX three for chest pain    RYBELSUS 14 mg tablet Take one tablet by mouth daily.    SOTALOL AF 80 mg tablet Take 1 tablet by mouth twice daily    tamsulosin (FLOMAX) 0.4 mg capsule Take one capsule by mouth daily.

## 2023-04-23 NOTE — Telephone Encounter
Received return call from [facility] with Dr. Selinda Eon office. Jacki Cones requests that blank letter of dental clearance be faxed to her at [phone]. Blank letter of dental clearance faxed to Lexa.

## 2023-04-23 NOTE — Progress Notes
Letter of dental clearance received and sent to medical records to be scanned to patient's chart.

## 2023-04-23 NOTE — Telephone Encounter
Left voicemail for patient's dentist, Dr. Pearletha Alfred [phone]), requesting dental clearance for mutual patient. Offered to fax a blank letter of dental clearance. Provided valve clinic nurses direct phone & fax numbers.

## 2023-04-26 ENCOUNTER — Encounter: Admit: 2023-04-26 | Discharge: 2023-04-26 | Payer: MEDICARE

## 2023-04-26 NOTE — Progress Notes
Medicare is listed as patient's primary insurance coverage.  Pre-certification is not required for hospitalizations.

## 2023-04-29 ENCOUNTER — Encounter: Admit: 2023-04-29 | Discharge: 2023-04-29 | Payer: MEDICARE

## 2023-04-29 NOTE — Telephone Encounter
Received voicemail from patient's wife, Lynnell Dike, requesting return call to discuss a dental appointment that patient had. Called & spoke to Susie who states patient had a routine dental cleaning & was informed that he needs a tooth extracted. Susie states that patient's extraction is scheduled for 8/19. Notified Susie that a new letter of dental clearance will be required prior to scheduling patient's TAVR. Susie communicates understanding & is agreeable.

## 2023-05-08 NOTE — Telephone Encounter
Blank letter of dental clearance faxed to Dr. Delight Stare at [phone].

## 2023-05-09 ENCOUNTER — Encounter: Admit: 2023-05-09 | Discharge: 2023-05-09 | Payer: MEDICARE

## 2023-05-09 DIAGNOSIS — I350 Nonrheumatic aortic (valve) stenosis: Secondary | ICD-10-CM

## 2023-05-09 LAB — CBC
RBC COUNT: 4.5 mg/dL — ABNORMAL HIGH (ref 70–100)
WBC COUNT: 5.1 MMOL/L (ref 98–110)

## 2023-05-09 LAB — BASIC METABOLIC PANEL
ANION GAP: 9 (ref 3–12)
CALCIUM: 9.2 U/L (ref 7–40)
CO2: 23 g/dL (ref 6.0–8.0)
CREATININE: 1 g/dL (ref 3.5–5.0)
GFR ESTIMATED: 76 MMOL/L (ref 21–30)
GLUCOSE,PANEL: 143 U/L (ref 25–110)
POTASSIUM: 4.2 mg/dL — ABNORMAL HIGH (ref 0.4–1.24)
SODIUM: 137 mg/dL — ABNORMAL HIGH (ref 7–25)

## 2023-05-09 NOTE — Telephone Encounter
Letter of dental clearance received and sent to medical records to be scanned to patient's chart.

## 2023-05-14 ENCOUNTER — Encounter: Admit: 2023-05-14 | Discharge: 2023-05-14 | Payer: MEDICARE

## 2023-05-14 MED ORDER — SOTALOL AF 80 MG PO TAB
80 mg | ORAL_TABLET | Freq: Two times a day (BID) | ORAL | 3 refills | 30.00000 days | Status: AC
Start: 2023-05-14 — End: ?

## 2023-05-15 ENCOUNTER — Encounter: Admit: 2023-05-15 | Discharge: 2023-05-15 | Payer: MEDICARE

## 2023-05-15 DIAGNOSIS — I350 Nonrheumatic aortic (valve) stenosis: Secondary | ICD-10-CM

## 2023-05-15 DIAGNOSIS — I2510 Atherosclerotic heart disease of native coronary artery without angina pectoris: Secondary | ICD-10-CM

## 2023-05-15 NOTE — Patient Instructions
CARDIAC CATHETERIZATION   PRE-ADMISSION INSTRUCTIONS    Patient Name: Darius Cherry  MRN#: [id number]  Date of Birth: 11-Jan-1951 (72 y.o.)  Today's Date: 04/23/2023    PROCEDURE:  You are scheduled for a Coronary Angiogram with possible Angioplasty/Stenting with Dr. Idamae Lusher.    PROCEDURE DATE AND ARRIVAL TIME:  Your procedure date is Monday, June 10, 2023.  You will receive a call from the Cath lab staff between 8:00 a.m. and noon on the business day prior to your procedure to let you know at what time to arrive after your appointment with Norman Clay, APRN.    Please check in at the Admitting Desk in the [facility] for your procedure.   Address:  [address], [place], [place] [address]    [facility] Entrance and and take a right. Continue down the hallway past the Cardiovascular Medicine office. That hall will take you into the [facility]. Check in at the desk on the left side.)     (If you have further questions regarding your arrival time for the CV lab, please call [phone] by 3:00pm the day before your procedure. Please leave a message with your name and number, your call will be returned in a timely manner.)    PRE-PROCEDURE APPOINTMENTS:    9/23 at 9:30 am   Office visit to update history and physical (requirement within 30 days of procedure)  with Norman Clay, APRN at Cardiovascular Medicine [facility]       9/9 - 9/18    Pre-Admission lab work required within 14 days of procedure: BMP and CBC at the lab of your choice.         FOOD AND DRINK INSTRUCTIONS  Nothing to eat after midnight before your procedure. No caffeine for 24 hours prior to your procedure. You will be under moderate sedation for your procedure.  You may drink clear liquids up to an hour before hospital arrival. This will be confirmed by the Cath lab staff the day before your procedure.     SPECIAL MEDICATION INSTRUCTIONS  Any new prescriptions will be sent to your pharmacy listed on file with [place]. Please either take 4 baby aspirins (4 times 81mg ) or one full strength NON-COATED 325mg  aspirin the morning of your procedure.    apixaban (Eliquis): hold 1 days and AM of procedure. Last dose on 9/21.     Hypoglycemics: metformin (Glucophage) -- hold the morning of your procedure, semaglutide (Rybelsus) -- hold the morning of your procedure, and empagliflozin (Jardiance) -- hold the morning of your procedure.      HOLD ALL erectile dysfunction medications for 3 days, unless prescribed for pulmonary hypertension.     HOLD ALL over the counter vitamins or supplements on the morning of your procedure.      Additional Instructions  If you wear CPAP, please bring your mask and machine with you to the hospital.    Take a bath or shower with anti-bacterial soap the evening before, or the morning of the procedure.     Bring photo ID and your health insurance card(s).    Arrange for a driver to take you home from the hospital. Please arrange for a friend or family member to take you home from this test. You cannot take a Taxi, [employer], or public transportation as there has to be a responsible person to help care for you after sedation    Bring an accurate list of your current medications with you to the  hospital (all medications and supplements taken daily).  Please use the medication list below and write in the date and time when you took your last dose before your procedure. Update this list of medications as needed.      Wear comfortable clothes and don't bring valuables, other than photo identification card, with you to the hospital.    Please pack a bag for a possible overnight stay.     Please review your pre-procedure instructions and bring them with you on the day of your procedure. Call the valve clinic nurses, [facility], with any questions [phone].        ALLERGIES  Allergies   Allergen Reactions    Celebrex [Celecoxib] RASH and ITCHING       CURRENT MEDICATIONS  Outpatient Encounter Medications as of 04/23/2023   Medication Sig Dispense Refill    acetaminophen (TYLENOL) 325 mg tablet Take two tablets by mouth every 4 hours as needed.  0    amoxicillin(+) (AMOXIL) 500 mg tablet 2g 1hr prior to procedure; 1gm 6hr after procedure (Patient taking differently: as Needed (prior to dental procedures).) 6 tablet 1    apixaban (ELIQUIS) 5 mg tablet Take one tablet by mouth twice daily.      atorvastatin (LIPITOR) 40 mg tablet Take 1 tablet by mouth once daily 90 tablet 3    calcium carbonate/vitamin D-3 (OSCAL-500+D) 1250 mg/200 unit tablet Take one tablet by mouth twice daily. Calcium Carb 1250mg  delivers 500mg  elemental Ca      carvediloL (COREG) 3.125 mg tablet Take one tablet by mouth twice daily with meals. Take with food.      empagliflozin (JARDIANCE) 25 mg tablet Take one tablet by mouth every morning.      metFORMIN-XR (GLUCOPHAGE XR) 500 mg extended release tablet Take one tablet by mouth twice daily.      nitroglycerin (NITROSTAT) 0.4 mg tablet Take one sublingual nitro Q 5 minX three for chest pain 25 Tab 6    RYBELSUS 14 mg tablet Take one tablet by mouth daily.      SOTALOL AF 80 mg tablet Take 1 tablet by mouth twice daily 180 tablet 3    tamsulosin (FLOMAX) 0.4 mg capsule Take one capsule by mouth daily.       No facility-administered encounter medications on file as of 04/23/2023.       _________________________________________  Form completed by: Lauraine Rinne, RN  Date completed: 04/23/23  Method: Via telephone and mailed to the patient.          Coronary Angiography  Angiography is a special type of moving X-ray that lets your doctor view your coronary arteries to see if the blood vessels to your heart are narrowed or blocked. This test is done when someone is having a heart attack. Or it may be done if symptoms may mean a heart attack. It also may be done after an abnormal cardiac stress test.    Before the procedure   Tell your healthcare team what medicines you take and any allergies you may have.  Tell your healthcare team if you've had a reaction to contrast dye or have had any kidney problems.  Follow any directions you are given for not eating or drinking before surgery.  A nurse will place an IV (intravenous) catheter in your vein to give fluids, and medicine to relieve pain and help you feel less anxious.  They clean your skin and shave the area where the catheter will be inserted, if needed.  During the procedure   You will lie on a table with a portable X-ray machine over you. The team will place a surgical drape over your body. The area where the doctor chooses to insert the catheter will be cleaned. This will be either a wrist or the groin.  Your doctor will place a long, thin tube (catheter) inside an artery in your groin or arm and guide it into your heart. You may feel pressure with the insertion of the catheter. A numbing medicine often is injected at the insertion site. This eases discomfort during the procedure.  They will inject a contrast dye through the catheter into your blood vessels or heart chambers. You may feel a warm sensation or feeling like you have to urinate when the contrast is injected. This is normal.  X-rays are taken to show images of the inside of your heart and coronary arteries.     The catheter can be placed into the groin, arm, or wrist.     After the procedure   Your healthcare team will tell you how long to lie down and keep the insertion site still. The amount of time may depend on whether a closure device such as a stitch or collagen plug was used to close the opening made in your artery. The time you must be still may be shorter if one of these devices was used. The amount of time will also depend on if there is any bleeding at the catheter insertion site.  If the insertion site was in your groin, you may need to lie down with your leg still for several hours. If the insertion site was in your wrist, a pressure bandage may be put on the site. Or you may have closure device placed on the insertion site. It will be taken off when there is no sign of bleeding. If bleeding occurs, a nurse will put pressure on the area to control it.  A nurse will check your blood pressure and the insertion site often. This is to make sure you remain stable after the procedure.  You may be asked to drink fluid to help flush the contrast liquid out of your system.  Have someone drive you home from the hospital.  If your doctor uses angioplasty or a stent to treat a blocked artery, you may stay the night in the hospital. If there are multiple blockages that can't be fixed with a stent or angioplasty, you may need surgery to bypass the blockages. This is called coronary artery bypass graft surgery. Your doctor will explain the results of your test and what treatment options that may be best for you.  It?s normal to find a small bruise or lump at the insertion site. The lump may be the collagen plug or stitch that you feel, or a small bruise. These common side effects should disappear within a few weeks.  You will be given instructions by your healthcare team on recovering from the coronary angiography. In general, don't lift anything heavier than a gallon of milk for several days. This gives time for the puncture site in the artery wall to heal. Try not to get the puncture site wet. Don't put it under water. Showers are OK. Don't soak in a bathtub, swimming pool, or hot tub until the skin has healed.    When to call your healthcare provider   Call your healthcare provider right away if you have any of these:   Symptoms of infection. These include pain,  swelling, redness, bleeding, or drainage at the insertion site.  Fever of 100.4?F (38?C) or higher, or as advised by your provider  Bleeding, bruising, or a lot of swelling where the catheter was inserted  Blood in your urine  Black or tarry stools  Any unusual bleeding  Irregular, very slow, or fast heartbeat  Dizziness  Call 911  Call 911if any of these occur:     Chest pain  Shortness of breath  Sudden numbness or weakness in arms, legs, or face, or difficulty speaking  The puncture site swells up very fast  Bleeding from the puncture site that does not slow down with firm pressure  Severe or increasing pain, numbness, coldness, or a bluish color in the leg or arm that held the catheter    [employer] last reviewed this educational content on 10/18/2020  ? 2000-2024 [employer], [place]. All rights reserved. This information is not intended as a substitute for professional medical care. Always follow your healthcare professional's instructions.

## 2023-05-15 NOTE — Progress Notes
Patient's lab orders faxed to [facility] at [phone].

## 2023-05-15 NOTE — Telephone Encounter
Received voicemail from patient's wife, Lynnell Dike, stating that patient has tested positive for Covid on 8/27. Patient is scheduled for a heart cath today. Called & spoke to Susie to discuss rescheduling patient's heart cath. Patient's heart cath rescheduled for 9/23. Provided updated pre-procedure instructions to Susie. Susie communicates understanding. Susie states patient will have his labs drawn at the [facility]. Informed Susie that patient should resume his Eliquis. Susie states patient has resumed his Eliquis. Provided valve clinic nurses' direct phone number [phone]).

## 2023-06-06 ENCOUNTER — Encounter: Admit: 2023-06-06 | Discharge: 2023-06-06 | Payer: MEDICARE

## 2023-06-06 DIAGNOSIS — I350 Nonrheumatic aortic (valve) stenosis: Secondary | ICD-10-CM

## 2023-06-06 DIAGNOSIS — I2510 Atherosclerotic heart disease of native coronary artery without angina pectoris: Secondary | ICD-10-CM

## 2023-06-06 LAB — CBC
ABSOLUTE BASO COUNT: 0
ABSOLUTE EOS COUNT: 0.2
ABSOLUTE LYMPH COUNT: 1.1
ABSOLUTE MONO COUNT: 0.4
ABSOLUTE NEUTROPHIL: 3.4
IMMATURE GRANULOCYTES: 0
LYMPHOCYTES %: 21
NUCLEATED RBC %: 0
WBC COUNT: 5.3

## 2023-06-06 LAB — BASIC METABOLIC PANEL
ANION GAP: 11
BLD UREA NITROGEN: 17
BUN/CREATININE RATIO: 14
CALCIUM: 9.2
CHLORIDE: 102
CO2: 20
CREATININE: 1.2
GFR ESTIMATED: 60
GLUCOSE,PANEL: 213
POTASSIUM: 4.9
SODIUM: 133

## 2023-06-10 ENCOUNTER — Encounter: Admit: 2023-06-10 | Discharge: 2023-06-10 | Payer: MEDICARE

## 2023-06-10 ENCOUNTER — Ambulatory Visit: Admit: 2023-06-10 | Discharge: 2023-06-10 | Payer: MEDICARE

## 2023-06-10 DIAGNOSIS — I10 Essential (primary) hypertension: Secondary | ICD-10-CM

## 2023-06-10 DIAGNOSIS — I2510 Atherosclerotic heart disease of native coronary artery without angina pectoris: Secondary | ICD-10-CM

## 2023-06-10 DIAGNOSIS — I480 Paroxysmal atrial fibrillation: Secondary | ICD-10-CM

## 2023-06-10 DIAGNOSIS — R0989 Other specified symptoms and signs involving the circulatory and respiratory systems: Secondary | ICD-10-CM

## 2023-06-10 DIAGNOSIS — R011 Cardiac murmur, unspecified: Secondary | ICD-10-CM

## 2023-06-10 DIAGNOSIS — I350 Nonrheumatic aortic (valve) stenosis: Secondary | ICD-10-CM

## 2023-06-10 DIAGNOSIS — C4490 Unspecified malignant neoplasm of skin, unspecified: Secondary | ICD-10-CM

## 2023-06-10 DIAGNOSIS — I4891 Unspecified atrial fibrillation: Secondary | ICD-10-CM

## 2023-06-10 DIAGNOSIS — E119 Type 2 diabetes mellitus without complications: Secondary | ICD-10-CM

## 2023-06-10 DIAGNOSIS — E785 Hyperlipidemia, unspecified: Secondary | ICD-10-CM

## 2023-06-10 DIAGNOSIS — I219 Acute myocardial infarction, unspecified: Secondary | ICD-10-CM

## 2023-06-10 DIAGNOSIS — Z955 Presence of coronary angioplasty implant and graft: Secondary | ICD-10-CM

## 2023-06-10 DIAGNOSIS — T148XXA Other injury of unspecified body region, initial encounter: Secondary | ICD-10-CM

## 2023-06-10 MED ADMIN — SODIUM CHLORIDE 0.9 % IV SOLP [27838]: 500 mL | INTRAVENOUS | @ 16:00:00 | Stop: 2023-06-10 | NDC 00338004904

## 2023-06-10 MED ADMIN — SODIUM CHLORIDE 0.9 % IV SOLP [27838]: 250 mL | INTRAVENOUS | @ 16:00:00 | Stop: 2023-06-10 | NDC 00338004904

## 2023-06-10 NOTE — Progress Notes
Date of Service: 06/10/2023    Darius Cherry is a 72 y.o. male.       HPI       I had the pleasure of seeing Darius Cherry for an updated H&P prior to cardiac catheterization.  He is a 72 year old with history of coronary artery disease with PCI to the circumflex in 2007 and to the circumflex and LAD in 2013, paroxysmal A-fib on apixaban, hypertension, hyperlipidemia and type 2 diabetes.  He follows routinely with Dr. Barry Dienes.  He has aortic stenosis that Dr. Barry Dienes has been monitoring.  He had a recent echocardiogram that showed progressive and the aortic stenosis which is now severe.  His EF is 65%.  The aortic valve is heavily calcified.  The velocities 4.5 m/s with a mean gradient of 62 mmHg and valve area 0.72 cm?.  He also has moderate aortic insufficiency.  He was referred to valve clinic to consider intervention.-He was seen by Dr. Paris Lore and Dr. Helen Hashimoto and felt to be candidate for TAVR.  He is scheduled for cardiac catheterization but had COVID therefore it had to be canceled and postponed.  He did not require any treatment for COVID and has completely recovered.  He is also since had a skin tear to his left forearm due to injury on  his forearm.  It was treated with Steri-Strips and he was given p.o. antibiotics which she has since completed.  He keeps it covered but there is no signs of infection.  Per his report the Steri-Strips are still present.     Mr. Longest is very active.  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  He denies any significant limitations.  He states he gets short of breath with very heavy exertion or when it is hot.  He denies any chest pain, palpitations, near-syncope or syncope.  He has some lower extremity edema related to prior ankle surgery.     CTA 8/6:  CHEST:   1. Calcified aortic valve in keeping with aortic stenosis.   2. Densely calcified coronary arteries.   3. Normal caliber thoracic aorta with mild atherosclerosis. Major arch branch vessels are grossly patent. No subclavian artery stenosis.      ABDOMEN AND PELVIS:   1. Mild to moderate aortoiliac atherosclerosis. 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 Small common iliac   artery aneurysm.   2. No significant stenosis along either access pathway.             Vitals:    06/10/23 0928   BP: 122/62   BP Source: Arm, Left Upper   Pulse: 58   O2 Device: None (Room air)   PainSc: Zero   Weight: 90.4 kg (199 lb 6.4 oz)   Height: 175.3 cm (5' 9)     Body mass index is 29.45 kg/m?Darius Cherry     Past Medical History  Patient Active Problem List    Diagnosis Date Noted    Paroxysmal atrial fibrillation [facility]) 01/18/2022     12/18/2021 - Incidental finding of AF during routine physical.  Eliquis started.      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 Ludwig Lean: Successful percutaneous coronary intervention of the circumflex obtuse marginal bifurcation with a 3.0 x 23 mm Vision bare metal stent extending from the circumflex into the obtuse marginal and a 3.5 x 12 mm Vision bare metal stent in the circumflex at the bifurcation utilizing a Kulot stent technique.     05/20/12: Drug-eluting stent PCI to prox CFX and LAD by Dr. Bufford Buttner           Review of Systems   All other systems reviewed and are negative.      Physical Exam  General Appearance: no acute distress  Skin: warm & intact  HEENT: unremarkable  Neck Veins: neck veins are flat & not distended  Carotid Arteries: no bruits  Chest Inspection: chest is normal in appearance  Auscultation/Percussion: lungs clear to auscultation, no rales, rhonchi, or wheezing  Cardiac Rhythm: regular rhythm & normal rate  Cardiac Auscultation: Normal S1 & S2, no S3 or S4, no rub  Murmurs: 3/6 systolic murmur   Extremities: no lower extremity edema; 2+ symmetric distal pulses  Abdominal Exam: soft, non-tender, no masses, bowel sounds normal  Liver & Spleen: no organomegaly  Neurologic Exam: oriented to time, place and person; no focal neurologic deficits  Psychiatric: Normal mood and affect.  Behavior is normal. Judgment and thought content normal.       Cardiovascular Studies  Preliminary EKG:    Sinus bradycardia, rate 58 bpm.      Cardiovascular Health Factors  Vitals BP Readings from Last 3 Encounters:   06/10/23 122/62   04/23/23 134/70   04/23/23 134/70     Wt Readings from Last 3 Encounters:   06/10/23 90.4 kg (199 lb 6.4 oz)   04/23/23 91.2 kg (201 lb)   04/23/23 91.2 kg (201 lb)     BMI Readings from Last 3 Encounters:   06/10/23 29.45 kg/m?   04/23/23 28.03 kg/m?   04/23/23 28.03 kg/m?      Smoking Social History     Tobacco Use   Smoking Status Former    Current packs/day: 0.00    Average packs/day: 2.0 packs/day for 5.0 years (10.0 ttl pk-yrs)    Types: Cigarettes    Start date: 02/15/2001    Quit date: 02/15/2006    Years since quitting: 17.3   Smokeless Tobacco Never      Lipid Profile Cholesterol   Date Value Ref Range Status   12/11/2021 181  Final     HDL   Date Value Ref Range Status   12/11/2021 61  Final     LDL   Date Value Ref Range Status   12/11/2021 108 (H) <100 Final     Triglycerides   Date Value Ref Range Status   12/11/2021 63  Final      Blood Sugar Hemoglobin A1C   Date Value Ref Range Status   03/06/2023 8.5  Final     Glucose   Date Value Ref Range Status   06/05/2023 213  Final   05/09/2023 143  Final   01/25/2022 139 (H) 70 - 105 Final   10/23/2005 140 (H) 70 - 110 MG/DL Final   84/69/6295 284 (H) 70 - 110 MG/DL Final     Glucose, POC   Date Value Ref Range  Status   03/28/2018 179 (H) 70 - 100 MG/DL Final   45/40/9811 914 (H) 70 - 100 MG/DL Final   78/29/5621 308 (H) 70 - 100 MG/DL Final          Problems Addressed Today  Encounter Diagnoses   Name Primary?    Nonrheumatic aortic valve stenosis Yes    Cardiovascular symptoms     Paroxysmal atrial fibrillation (HCC)     Type 2 diabetes mellitus without complication, without long-term current use of insulin (HCC)     Essential hypertension     Coronary artery disease involving native coronary artery of native heart without angina pectoris        Assessment and Plan     1.  Aortic stenosis.  He was seen in valve clinic and felt to be candidate for TAVR.  He is scheduled for cardiac catheterization today.  He has been n.p.o. since midnight.  We discussed the procedure in detail and all questions were answered.     2.  Coronary artery disease.  He has had prior PCI to the circumflex and LAD.  He denies anginal symptoms.     3.  Paroxysmal A-fib.  He is on sotalol for rhythm control and apixaban for anticoagulation which he last took on Saturday..     4.  Hypertension.  He is on carvedilol.  His blood pressure is well-controlled.     5.  Type 2 diabetes.     Thank you for allowing [place] to participate in the care of this pleasant individual.  If you have any other questions or concerns, please do not hesitate to contact us.     Sherrlyn Hock, APRN  Structural Heart Nurse Practitioner  Pager [phone]         Current Medications (including today's revisions)   acetaminophen (TYLENOL) 325 mg tablet Take two tablets by mouth every 4 hours as needed.    amoxicillin(+) (AMOXIL) 500 mg tablet 2g 1hr prior to procedure; 1gm 6hr after procedure (Patient taking differently: as Needed (prior to dental procedures).)    apixaban (ELIQUIS) 5 mg tablet Take one tablet by mouth twice daily.    atorvastatin (LIPITOR) 40 mg tablet Take 1 tablet by mouth once daily    calcium carbonate/vitamin D-3 (OSCAL-500+D) 1250 mg/200 unit tablet Take one tablet by mouth twice daily. Calcium Carb 1250mg  delivers 500mg  elemental Ca    carvediloL (COREG) 3.125 mg tablet Take one tablet by mouth twice daily with meals. Take with food.    empagliflozin (JARDIANCE) 25 mg tablet Take one tablet by mouth every morning.    metFORMIN-XR (GLUCOPHAGE XR) 500 mg extended release tablet Take one tablet by mouth twice daily.    nitroglycerin (NITROSTAT) 0.4 mg tablet Take one sublingual nitro Q 5 minX three for chest pain    RYBELSUS 14 mg tablet Take one tablet by mouth daily.    sotaloL (SOTALOL AF) 80 mg tablet Take 1 tablet by mouth twice daily    tamsulosin (FLOMAX) 0.4 mg capsule Take one capsule by mouth daily.

## 2023-06-12 DIAGNOSIS — I4891 Unspecified atrial fibrillation: Secondary | ICD-10-CM

## 2023-06-12 DIAGNOSIS — R739 Hyperglycemia, unspecified: Secondary | ICD-10-CM

## 2023-06-12 DIAGNOSIS — F10139 Alcohol abuse with withdrawal (HCC): Secondary | ICD-10-CM

## 2023-06-12 DIAGNOSIS — I25118 Atherosclerotic heart disease of native coronary artery with other forms of angina pectoris: Secondary | ICD-10-CM

## 2023-06-12 DIAGNOSIS — Z9911 Dependence on respirator [ventilator] status: Secondary | ICD-10-CM

## 2023-06-12 DIAGNOSIS — E782 Mixed hyperlipidemia: Secondary | ICD-10-CM

## 2023-06-12 DIAGNOSIS — Z952 Presence of prosthetic heart valve: Secondary | ICD-10-CM

## 2023-06-12 DIAGNOSIS — I350 Nonrheumatic aortic (valve) stenosis: Secondary | ICD-10-CM

## 2023-06-12 DIAGNOSIS — E119 Type 2 diabetes mellitus without complications: Secondary | ICD-10-CM

## 2023-06-12 DIAGNOSIS — Z9889 Other specified postprocedural states: Secondary | ICD-10-CM

## 2023-06-12 DIAGNOSIS — Z951 Presence of aortocoronary bypass graft: Secondary | ICD-10-CM

## 2023-06-17 ENCOUNTER — Encounter: Admit: 2023-06-17 | Discharge: 2023-06-17 | Payer: MEDICARE

## 2023-07-02 ENCOUNTER — Encounter: Admit: 2023-07-02 | Discharge: 2023-07-02 | Payer: MEDICARE

## 2023-07-02 ENCOUNTER — Ambulatory Visit: Admit: 2023-07-02 | Discharge: 2023-07-02 | Payer: MEDICARE

## 2023-07-02 DIAGNOSIS — C4490 Unspecified malignant neoplasm of skin, unspecified: Secondary | ICD-10-CM

## 2023-07-02 DIAGNOSIS — I2510 Atherosclerotic heart disease of native coronary artery without angina pectoris: Secondary | ICD-10-CM

## 2023-07-02 DIAGNOSIS — I4891 Unspecified atrial fibrillation: Secondary | ICD-10-CM

## 2023-07-02 DIAGNOSIS — T148XXA Other injury of unspecified body region, initial encounter: Secondary | ICD-10-CM

## 2023-07-02 DIAGNOSIS — R011 Cardiac murmur, unspecified: Secondary | ICD-10-CM

## 2023-07-02 DIAGNOSIS — I350 Nonrheumatic aortic (valve) stenosis: Secondary | ICD-10-CM

## 2023-07-02 DIAGNOSIS — I10 Essential (primary) hypertension: Secondary | ICD-10-CM

## 2023-07-02 DIAGNOSIS — E785 Hyperlipidemia, unspecified: Secondary | ICD-10-CM

## 2023-07-02 DIAGNOSIS — I219 Acute myocardial infarction, unspecified: Secondary | ICD-10-CM

## 2023-07-02 DIAGNOSIS — I480 Paroxysmal atrial fibrillation: Secondary | ICD-10-CM

## 2023-07-02 DIAGNOSIS — E119 Type 2 diabetes mellitus without complications: Secondary | ICD-10-CM

## 2023-07-02 DIAGNOSIS — Z955 Presence of coronary angioplasty implant and graft: Secondary | ICD-10-CM

## 2023-07-02 NOTE — Progress Notes
Date of Service: 07/02/2023       Subjective:             Darius Cherry is a 72 y.o. male.      History of Present Illness  We had the pleasure of seeing Darius Cherry who is referred for aortic stenosis and returns today for an updated H and P.  He is a 72 year old with history of coronary artery disease with PCI to the circumflex in 2007 and to the circumflex and LAD in 2013, paroxysmal A-fib on apixaban, hypertension, hyperlipidemia and type 2 diabetes.  He follows routinely with Dr. Barry Dienes.  He has aortic stenosis that Dr. Barry Dienes has been monitoring.  He had a recent echocardiogram that showed progressive and the aortic stenosis which is now severe.  His EF is 65%.  The aortic valve is heavily calcified.  The velocities 4.5 m/s with a mean gradient of 62 mmHg and valve area 0.72 cm?.  He also has moderate aortic insufficiency.  He was referred to valve clinic to consider intervention, but found to have coronary artery disease requiring intervention. Plan is for CABG and aortic valve replacement on 07/22/23     [name] is very active.  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  He denies any significant limitations.  He states he gets short of breath with very heavy exertion or when it is hot.  He denies any chest pain, palpitations, near-syncope or syncope.  He has some lower extremity edema related to prior ankle surgery.    As a part of his work up he had:  CTA 04/23/23:  CHEST:   1. Calcified aortic valve in keeping with aortic stenosis.   2. Densely calcified coronary arteries.   3. Normal caliber thoracic aorta with mild atherosclerosis. Major arch   branch vessels are grossly patent. No subclavian artery stenosis.      ABDOMEN AND PELVIS:   1. Mild to moderate aortoiliac atherosclerosis. 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 Small common iliac   artery aneurysm.   2. No significant stenosis along either access pathway.      TTE on 04/17/23:  Mild left ventricular dilatation. Normal left ventricular systolic function with an estimated ejection fraction of 65%.  Left ventricular diastolic function cannot be determined.  Normal right ventricular size and systolic function.  Severe left atrial enlargement, mild right atrial enlargement.  Heavily calcified aortic valve with significantly restricted cusp mobility.  Doppler evaluation consistent with severe aortic stenosis and moderate aortic insufficiency.  V-max = 4.5 m/s, DVI = 0.22, MG = 62 mmHg, AVA = 0.72, SVI = 41 mL/m?, PHT = 473 ms  Normal central venous pressure (0-5 mm Hg).  Estimated PASP = 39 mmHg.  No pericardial effusion.     Comparison is made to a transthoracic study dated 01/08/22.  There has been an increase in left ventricular size, previously LV EDVI was 41 mL/m? and is now 79 mL/m?Marland Kitchen  There has been interval worsening of aortic stenosis and aortic regurgitation.  On the prior study, there was moderate aortic stenosis by doppler assessment (AVA=1.4cm2, MG=72mmHg, DVI=0.31) and trace aortic regurgitation.  There continues to be preservation of biventricular function.    PFT on 06/10/23:  FEV1 82% predicted    Vein Mapping 06/10/23:  GSV is compressible and without evidence of superficial thrombosis   Vessel wall does not show  evidence of thickening  Right GSV is suitable for conduit per measurements from proximal thigh to the mid calf   Left GSV is suitable for conduit from the proximal thigh to the distal calf.   Full measurement list below    Carotid artery duplex 06/10/23:  There is mild to moderate atheromatous plaque visualized in bilateral common and internal carotid arteries  No hemodynamically significant (>50%) stenosis measured in the common and internal carotid arteries bilaterally  There is normal antegrade flow in bilateral vertebral arteries  No evidence of proximal subclavian stenosis bilaterally     Review of Systems   Constitutional: Negative. HENT: Negative.     Eyes: Negative.    Cardiovascular: Negative.    Respiratory: Negative.     Endocrine: Negative.    Hematologic/Lymphatic: Negative.    Skin: Negative.    Musculoskeletal: Negative.    Gastrointestinal: Negative.    Genitourinary: Negative.    Neurological: Negative.    Psychiatric/Behavioral: Negative.     Allergic/Immunologic: Negative.      Past Medical History:   Diagnosis Date    Aortic valve stenosis     Atrial fibrillation (HCC)     CAD (coronary artery disease) 04/26/2009    DM (diabetes mellitus) (HCC)     Fracture 2016    Left ankle    History of coronary artery stent placement 2007/ 2013    x4    Hyperlipemia 04/26/2009    Hypertension 04/26/2009    Murmur, cardiac     Myocardial infarction [facility]) 2007    x2 stents    Skin cancer     lips, head     Surgical History:   Procedure Laterality Date    ARTHROPLASTY REPLACEMENT TOTAL ANKLE (PROPHECY CASE [phone]) Left 11/05/2016    Performed by Ree Shay, MD at [facility] OR    LENGTHENING LEFT  ACHILLES TENDON Left 11/05/2016    Performed by Ree Shay, MD at [facility] OR    ARTHROPLASTY REVISION TOTAL ANKLE TO IN-BONE (PROPHECY [phone]) Left 03/28/2018    Performed by Ree Shay, MD at [facility] OR    REMOVAL HARDWARE - DEEP - LOWER EXTREMITY  03/28/2018    Performed by Ree Shay, MD at [facility] OR    ANGIOGRAPHY CORONARY ARTERY WITH LEFT HEART CATHETERIZATION N/A 06/10/2023    Performed by Laney Pastor, MD at [facility] CATH LAB    PERCUTANEOUS CORONARY STENT PLACEMENT WITH ANGIOPLASTY N/A 06/10/2023    Performed by Laney Pastor, MD at [facility] CATH LAB    CORONARY ANGIOPLASTY      HAND SURGERY Left     HX CORONARY STENT PLACEMENT      HX HEART CATHETERIZATION  2007, 05/2012    x4 stents total     Family History   Problem Relation Name Age of Onset    Diabetes Mother Candi Leash     Stroke Mother Ruthanna     Coronary Artery Disease Father Konrad Dolores     Diabetes Father Konrad Dolores      Social History     Socioeconomic History    Marital status: Married   Tobacco Use    Smoking status: Former     Current packs/day: 0.00     Average packs/day: 2.0 packs/day for 5.0 years (10.0 ttl pk-yrs)     Types: Cigarettes     Start date: 02/15/2001     Quit date: 02/15/2006     Years since quitting: 17.3    Smokeless tobacco: Never   Vaping Use  Vaping status: Never Used   Substance and Sexual Activity    Alcohol use: Yes     Alcohol/week: 2.0 standard drinks of alcohol     Types: 2 Cans of beer per week     Comment: per day    Drug use: No     Vaping/E-liquid Use    Vaping Use Never User                 Objective:          acetaminophen (TYLENOL) 325 mg tablet Take two tablets by mouth every 4 hours as needed.    amoxicillin(+) (AMOXIL) 500 mg tablet 2g 1hr prior to procedure; 1gm 6hr after procedure (Patient taking differently: as Needed (prior to dental procedures).)    apixaban (ELIQUIS) 5 mg tablet Take one tablet by mouth twice daily.    atorvastatin (LIPITOR) 80 mg tablet Take one tablet by mouth daily. Indications: hardening of the arteries due to plaque buildup    calcium carbonate/vitamin D-3 (OSCAL-500+D) 1250 mg/200 unit tablet Take one tablet by mouth twice daily. Calcium Carb 1250mg  delivers 500mg  elemental Ca    carvediloL (COREG) 3.125 mg tablet Take one tablet by mouth twice daily with meals. Take with food.    empagliflozin (JARDIANCE) 25 mg tablet Take one tablet by mouth every morning.    metFORMIN-XR (GLUCOPHAGE XR) 500 mg extended release tablet Take one tablet by mouth twice daily.    nitroglycerin (NITROSTAT) 0.4 mg tablet Take one sublingual nitro Q 5 minX three for chest pain    RYBELSUS 14 mg tablet Take one tablet by mouth daily.    sotaloL (SOTALOL AF) 80 mg tablet Take 1 tablet by mouth twice daily    tamsulosin (FLOMAX) 0.4 mg capsule Take one capsule by mouth daily.     Vitals:    07/02/23 1524   BP: (!) 146/80   BP Source: Arm, Left Upper   Pulse: 72   SpO2: 94%   PainSc: Zero   Weight: 90.7 kg (200 lb)   Height: 180.3 cm (5' 11)     Body mass index is 27.89 kg/m?Marland Kitchen Physical Exam  Vitals reviewed.   HENT:      Head: Normocephalic.      Mouth/Throat:      Mouth: Mucous membranes are moist.   Eyes:      Pupils: Pupils are equal, round, and reactive to light.   Cardiovascular:      Rate and Rhythm: Normal rate and regular rhythm.      Pulses: Normal pulses.      Heart sounds: Murmur heard.   Skin:     General: Skin is warm and dry.      Capillary Refill: Capillary refill takes less than 2 seconds.   Neurological:      General: No focal deficit present.      Mental Status: He is alert.   Psychiatric:         Attention and Perception: Attention normal.         Mood and Affect: Mood normal.         Speech: Speech normal.          Assessment and Plan:  1. Nonrheumatic aortic valve stenosis    2. Coronary artery disease without angina pectoris, unspecified vessel or lesion type, unspecified whether native or transplanted heart    3. Hyperlipidemia, unspecified hyperlipidemia type    4. Essential hypertension    5. Paroxysmal atrial fibrillation (HCC)  Plan to proceed with surgery as planned. Plan for aortic valve replacement (tissue) and CABG with endoscopic vein harvest on 07/22/23.    Jeneen Rinks, APRN  Thoracic and Cardiovascular Surgery  [phone]

## 2023-07-09 IMAGING — CR KNEELMLT
2 series · 2 of 2 positions shown · non-contrast
Comparison: none

[t knee ap left]
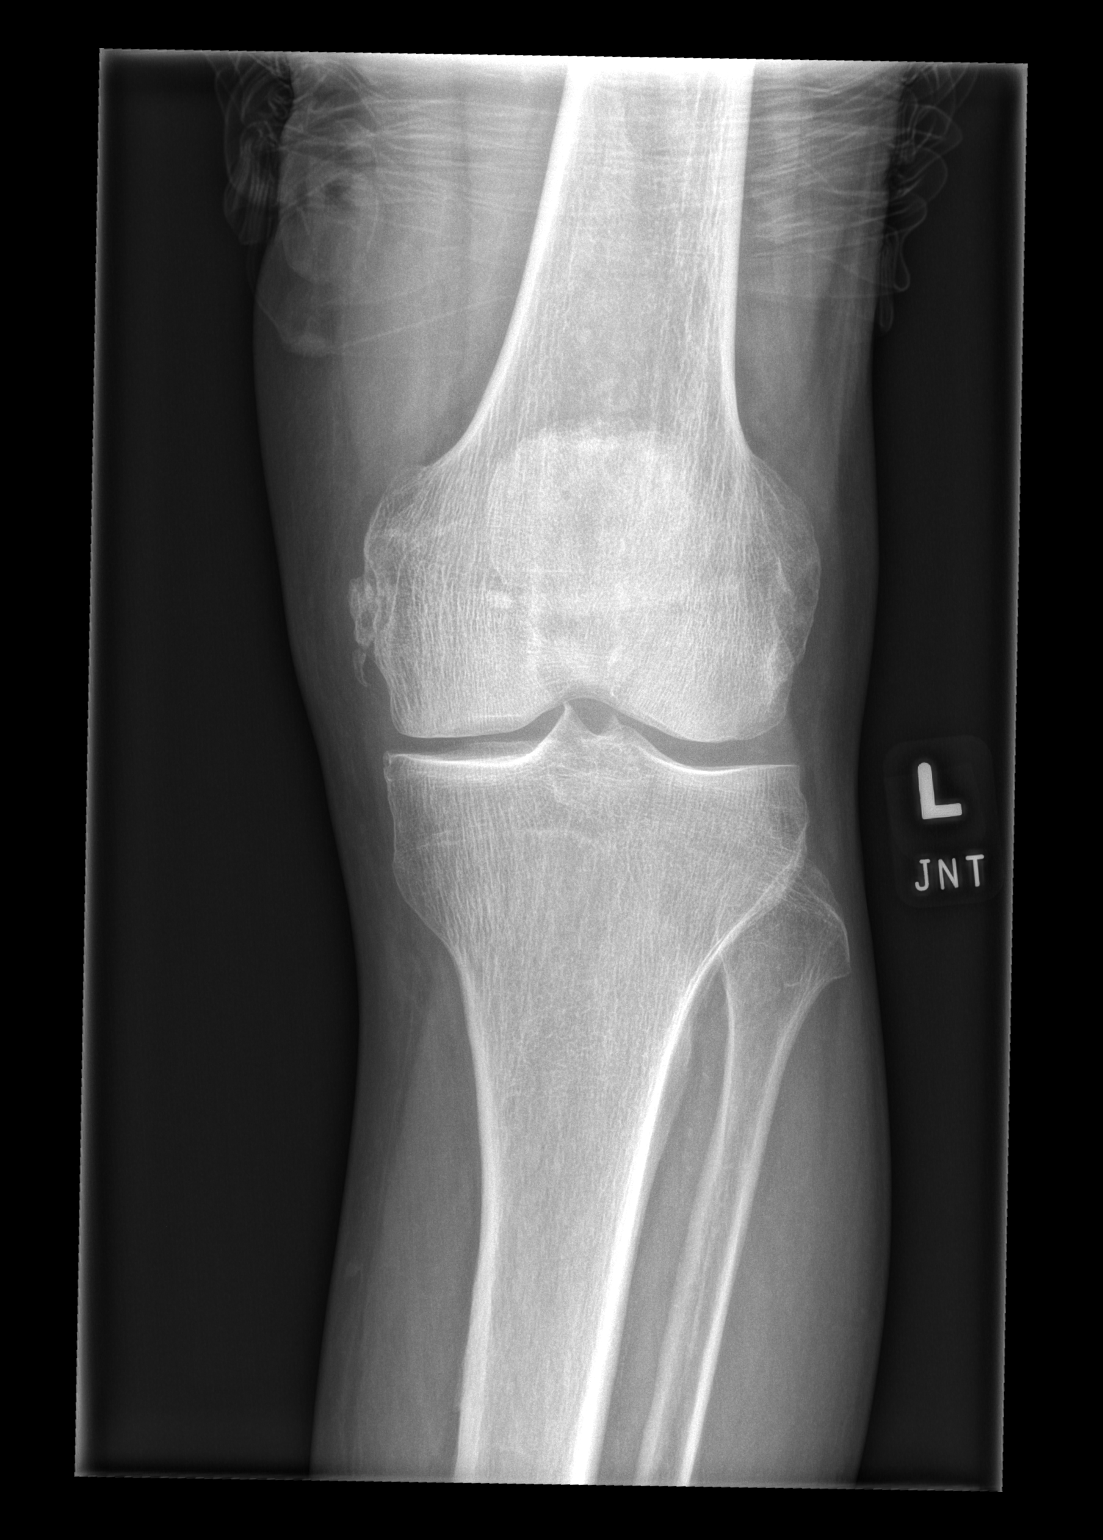

[t knee lat left]
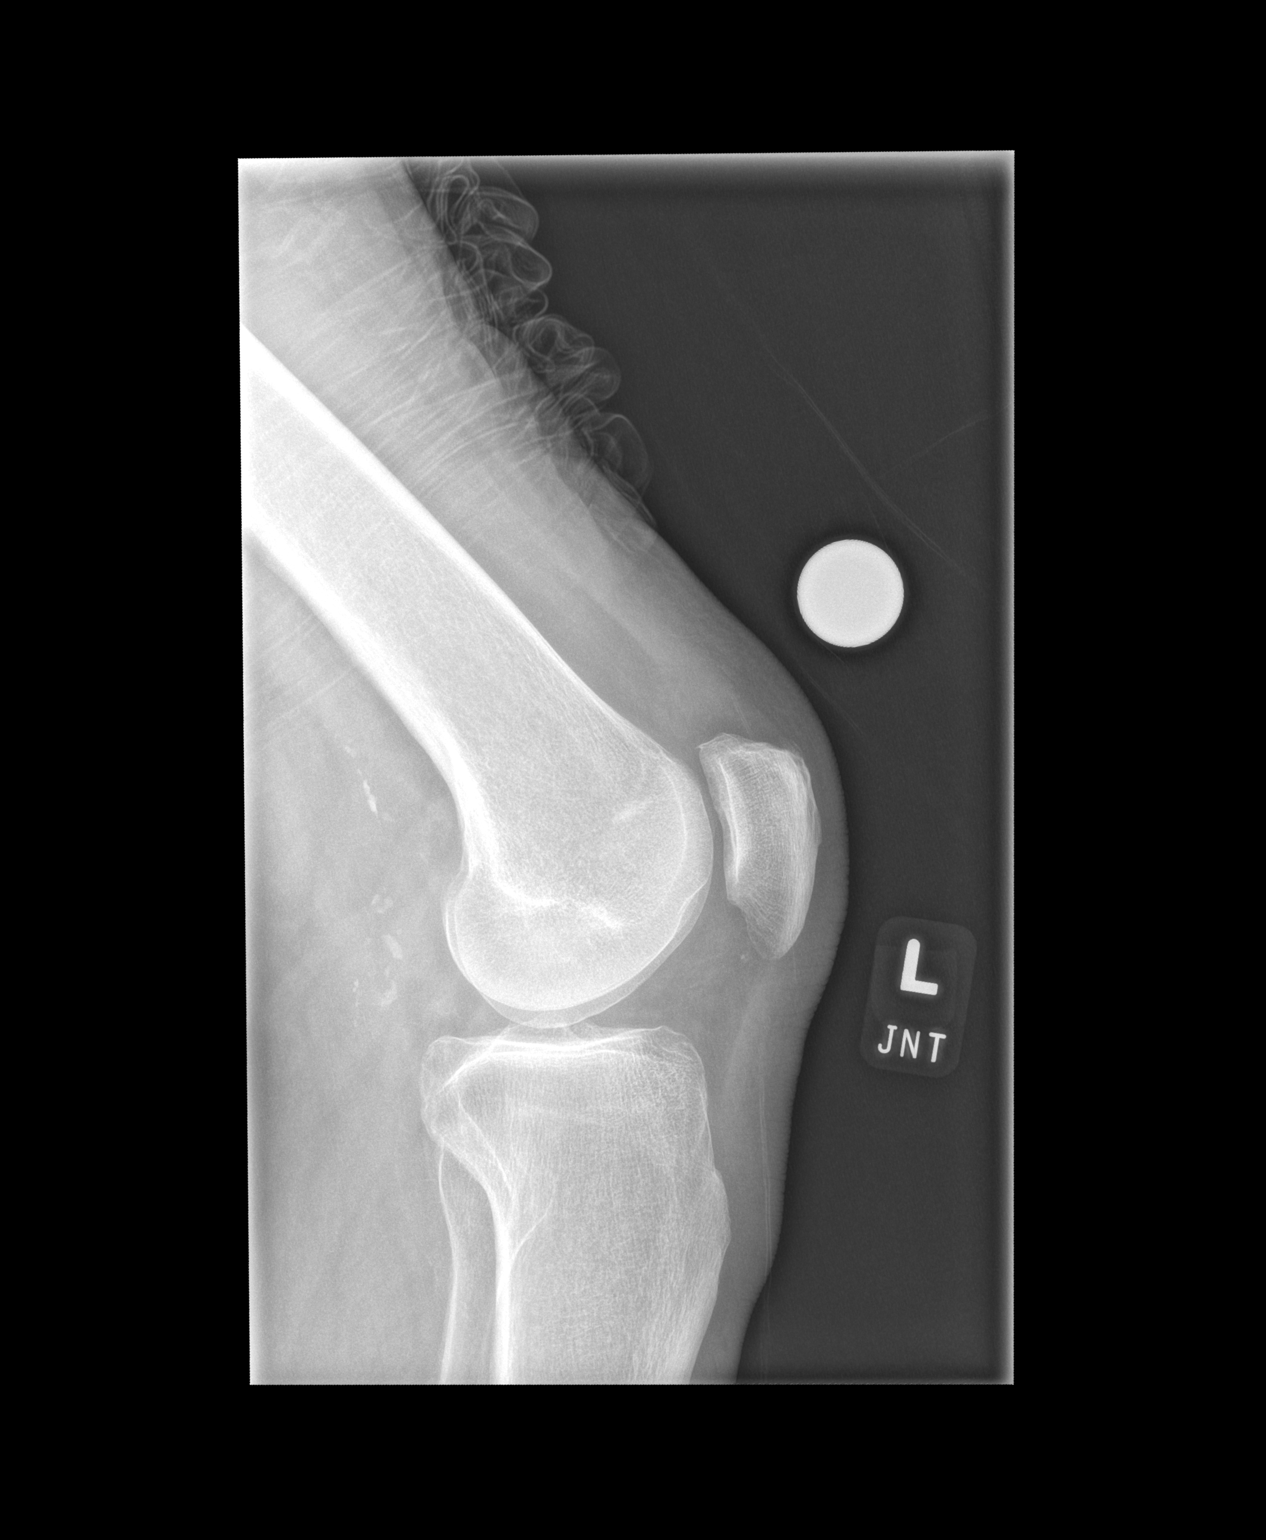

[2 of 2 positions shown; findings below may reference images not displayed]

DIAGNOSTIC STUDIES

EXAM

XR knee LT 2V

INDICATION

Worsening Left knee pain, hx possible metal FB
Pt c/o worsening left knee pain. Pt states possible metal on the lateral side of kneecap. JT

TECHNIQUE

AP and lateral views

COMPARISONS

None available

FINDINGS

Dystrophic calcification is seen along the MCL consistent with old injury. There is mild medial
narrowing and trace knee effusion. Calcification of the popliteal artery is evident. No radiopaque
foreign bodies are seen.

IMPRESSION

Degenerative changes as described with trace knee effusion. No fractures or radiopaque foreign
bodies are evident.

Tech Notes:

Pt c/o worsening left knee pain. Pt states possible metal on the lateral side of kneecap. JT

## 2023-07-16 ENCOUNTER — Encounter: Admit: 2023-07-16 | Discharge: 2023-07-16 | Payer: MEDICARE

## 2023-07-18 NOTE — Discharge Instructions - Pharmacy
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]  Paroxysmal atrial fibrillation [facility]) [I48.0]  Coronary artery disease involving native coronary artery of native heart without angina pectoris [I25.10]  CAD in native artery [I25.10]    Significant PMH:   Past Medical History:   Diagnosis Date    Aortic valve stenosis     Atrial fibrillation (HCC)     CAD (coronary artery disease) 04/26/2009    DM (diabetes mellitus) (HCC)     Fracture 2016    Left ankle    History of coronary artery stent placement 2007/ 2013    x4    Hyperlipemia 04/26/2009    Hypertension 04/26/2009    Murmur, cardiac     Myocardial infarction [facility]) 2007    x2 stents    Skin cancer     lips, head         Allergies: Celebrex [celecoxib]    Admission Physical Exam notable for:  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  He follows routinely with Dr. Barry Dienes.  He has aortic stenosis that Dr. Barry Dienes has been monitoring.  He had a recent echocardiogram that showed progressive aortic stenosis which is now severe.  His EF is 65%.  The aortic valve is heavily calcified.  The velocities 4.5 m/s with a mean gradient of 62 mmHg and valve area 0.72 cm?.  He also has moderate aortic insufficiency.  He was referred to valve clinic to consider intervention.-He was seen by Dr. Paris Lore and Dr. Helen Hashimoto and felt to be candidate for TAVR.  He underwent cardiac catheterization today (06/10/23) which revealed multivessel coronary artery disase (LAD, OM, RCA, PL) which made him an open surgical candidate. The patient presented on 11/4 for a CABG x3, aortic valve replacement and ligation of his left atrial appendage. This surgery went well and the patient was transferred to the ICU in stable condition. The patient did initially require pressors due to vasogenic shock. The patient did have several episodes of atrial fib. EP was consulted to assist. A TEE and cardioversion was scheduled but the patient converted to sinus rhythm. We initially started coumadin for his anticoagulation but opted to resume his home Eliquis. The patient is a type 2 diabetic and endocrine was consulted to  assist. He it was recommended he try an injectable GLP-1 but the patient wanted to stay with oral medications. He was ultimately transferred to the step down unit. His activity level improved daily. Currently he is ambulating without complication, tolerating oral intake and his pain is well controlled. At this time we do feel comfortable discharging the patient home. We will hold off at this time resume his home coreg due to low blood pressure. His weight remains up and we will discharge him on a week of diuretics.         Pending items needing follow up: None    Condition at Discharge: Stable     Discharge Diagnoses:    Hospital Problems          Active Problems    * (Principal) S/P CABG x 3    Hyperlipemia    Coronary artery disease involving native coronary artery of native heart    Overweight (BMI 25.0-29.9)    Essential hypertension    Type 2 diabetes mellitus without complication, without long-term current use of insulin (HCC)    Paroxysmal atrial fibrillation (HCC)    S/P AVR (aortic valve replacement)    Acute blood loss anemia    Anticoagulated on warfarin    Atrial fibrillation status post cardioversion (HCC)    Alcohol abuse with withdrawal (HCC)    S/P left atrial appendage ligation    Pleural effusion, bilateral    Hypocalcemia    Acute on chronic diastolic CHF (congestive heart failure), NYHA class 2 (HCC)    Atherosclerosis of aorta (HCC)    Hypokalemia       Resolved Problems    RESOLVED: On mechanically assisted ventilation (HCC)    RESOLVED: Nonrheumatic aortic valve stenosis    RESOLVED: Platelet dysfunction (HCC)    RESOLVED: Thrombocytopenia (HCC)    RESOLVED: Vasogenic shock (HCC)    RESOLVED: Stress hyperglycemia    RESOLVED: SIRS without infection or organ dysfunction (HCC)    RESOLVED: Post-operative pain    RESOLVED: Physical deconditioning       Surgical Procedures:   07/22/23  Dr. Sindy Messing - Coronary artery bypass grafting x3, left internal mammary artery take down, endoscopic vein harvest, Aortic Valve Replacement- tissue, left atrial appendage ligation.      LIMA to LAD, reverse saphenous vein graft to OM 2, reverse saphenous vein graft to left posterior lateral vessel       Significant Diagnostic Studies and Procedures: noted in brief hospital course    Consults:  CONSULT ENDOCRINOLOGY PHYSICIAN  CONSULT CARDIOLOGY PHYSICIAN    Patient Disposition: Home       Patient instructions/medications:      Chest 2 Views   Standing Status: Future Standing Exp. Date: 07/17/24    Disregard any other contact information included on this order;  CORRECT CONTACT INFORMATION:  Please cloud images to [facility] [facility]  Call for questions:  [phone]   PLEASE Fax results to: [phone]       Reason for exam:(Sign,Symptom,Reason) atelectasis      Cardiac Diet    Limiting unhealthy fats and cholesterol is the most important step you can take in reducing your risk for cardiovascular disease. Unhealthy fats include saturated and trans fats. Monitor your sodium and cholesterol intake. Restrict your sodium to 2g (grams) or 2000mg  (milligrams) daily, and your cholesterol to 200mg  daily.    If you have questions regarding your diet at home, you may contact a dietitian at [phone].  Diabetic Diet    You should eat between 1600 and 2000 calories per day.  This is equal to 60g (grams) of carbohydrates per meal, and 30g of carbohydrates for a bedtime snack.    If you have questions about your diet after you go home, you can call a dietitian at [phone].     Other Activity Restrictions    -You should and need to walk daily. Your goal is to walk 30 minutes at at time without stopping for breaks. This is a daily exercise routine that you should start as soon as you arrive home. Your basic daily activities do not count toward your 30 minute minimum, e.g. housework, toileting, fixing meals. Do not exercise outside in extremely hot or cold temperatures.  -Driving: NO driving for a minimum of 2 weeks. In addition, don't drive if you are using narcotic pain medication or experiencing unsteadiness, dizziness, lightheadedness, or instability in movement, you should not be operating any type of vehicle; car, tractor, etc. Your surgeon may provide additional driving restrictions at their discretion based on the type and extent of surgery you had. Safely operating a vehicle is the responsibility of the driver.   -Lifting: NO lifting more than 10 pounds (gallon of milk) for 6 weeks.  -Monitoring: If you have access to a home blood pressure cuff, record your blood pressure and heart rate daily.   Please call if your systolic blood pressure is <90 or >160, OR  if your heart rate is <50 or >120 at rest     Incision Care and Bathing Instructions    Keep your incision(s) clean and dry.  Spray your incision with the provided wound cleanser twice a day until your incision is healed, or the bottle is empty.  It is ok to shower unless otherwise noted.  Do not soak in a bath tub, hot tub, pool or lake for 6 weeks.  Do not use creams or lotions on incision until it is fully healed.  If your surgeon used Dermabond (skin glue), this will fall off gradually, do not pick at the glue.  If you notice redness, swelling, drainage, foul odor, or sutures sticking up through your incision, please call the Cardiothoracic Surgery clinic immediately.  [phone]     Report These Signs and Symptoms    Please contact your doctor for the following symptoms:  *Temperature over 100 degrees F  *Uncontrolled pain  *Drainage with a foul odor  *Shortness of breath  *Racing or skipping heart beats  *Popping or clicking of your breastbone  *Suture material sticking up through your incisions  *Change in coordination of ability to talk  *If you gain more than 2 pounds in 24 hours, or 5 pounds in one week.     Cardiothoracic Surgery Return Appointment    You will have a chest xray at 9:15 AM with an appointment to follow.    Where do I go for my x-ray?  Enter the main hospital entrance  Go past the information desk, past the escalators, to the six-pack of elevators.  (If you reach the cafeteria you have gone too far!)  Take the elevator to the 2nd floor.  Follow the sign for General Radiology - you will go down a short hallway.  Once your x-ray is complete, return to the main floor and check-in for your appointment at the Cardiovascular Medicine and Cardiovascular and Thoracic Surgery office located by the main entrance.     [place] Provider Laban Emperor III [phone]  [facility]    Appointment date: 08/20/2023    Appointment time: 9:15 AM      Cardiology Return Appointment    Patient will be notified for follow up cardiology appointment     [place] Provider Vanice Sarah [[id number]]      Primary Care Follow Up Appointment    Wilford Grist [phone]  Call to schedule an appointment with your primary care doctor in 1-2 weeks for a checkup and medication review.     Outpatient Cardiac Rehabilitation Instructions    Your physician has referred you to participated in outpatient cardiac rehabilitation. Contact [facility] Cardiac Rehabilitation Department at [phone] to schedule an appointment.    If you will be attending cardiac rehab at a different facility, a referral will be sent to the facility of your choice.  If you have not heard from that facility within 2 weeks after you have been discharged, please call them to schedule an appointment.    Referral to outpatient cardiac rehab:    Outside referral faxed:    Internal referral sent to:       Risk Reduction Plan    Cardiac Event Personal Risk Factor Reduction Plan  **Take this sheet to your physician to show treatment recommendations**  Inda Coke  Admission Date: (Not on file) LOS: @LOS @       Blood Pressure Risk  Goal: Keep blood pressure below 130/80  Your Numbers:  BP Readings from Last 1 Encounters:  07/02/23 : (!) 146/80     Plan: High blood pressure is the single most important risk factor for cardiac events because it's the #1 cause of cardiac events.  Take medication as prescribed and monitor your blood pressure.    Abnormal lipids (fats in blood) Risk   Goal: Total Cholesterol: <200  LDL (bad cholesterol): primary prevention <100   LDL (bad cholesterol): secondary prevention < 70  HDL (good cholesterol): >40 for men, >50 for women  Triglycerides: <150  Your Numbers: Cholesterol       Date                     Value               Ref Range           Status                06/10/2023               138                 <200 MG/DL          Final            ----------  LDL       Date                     Value               Ref Range           Status                06/10/2023               76                  <100 mg/dL          Final            ----------  HDL       Date                     Value               Ref Range           Status                06/10/2023               55                  >40 MG/DL           Final            ----------  Triglycerides       Date                     Value               Ref Range           Status                06/10/2023               69                  <150 MG/DL          Final            ----------  Plan: Diets high in saturated fat, trans fat and cholesterol can raise blood cholesterol levels increasing your risk of having a cardiac event.  Take medication as prescribed and eat a heart healthy, low sodium diet.    Smoking Risk   Goals: If you smoke, STOP!   Plan: Smoking DOUBLES your risk of having a cardiac event. Quitting can greatly reduce your risk. To register for smoking cessation program call 7377641595 or visit www.smokefree.gov    Diabetes Risk   Goal: Non-diabetic: Below 5.7%  Goal for diabetic: Less than 7%  Your Numbers: Hemoglobin A1C       Date                     Value               Ref Range           Status                06/10/2023               7.4 (H)             4.0 - 5.7 %         Final              Comment:    The ADA recommends that most patients with type 1 and type 2 diabetes maintain     an A1c level <7%.      ----------  Plan: If you have diabetes, even if treated, you are at an increased risk of having a cardiac event.     Alcohol Use Risk  Goal: Alcohol use can lead to a cardiac event.  Plan: For men, limit intake to no more than 2 drinks per day.  For women, limit intake to no more than one drink per day.    Weight Management Risk   Goal: Healthy: BMI is 18.5 to 24.9  Overweight: BMI is 25  to 29.9  Obese: BMI is 30 or higher  Morbid Obesity: BMI [...]  Your Numbers: Your    Plan: If you have questions about your diet after you go home, you can call a dietitian at [phone]    Physical Activity Risk   Goal: Patients should have approval by a physician prior to beginning an exercise program.  Plan: Try to get at least 30 minutes of moderate physical activity five days a week or 20 minutes of vigorous physical activity three days a week with your doctor's approval.    To the Neurology and the NeuroSurg Discharge order sets set add a new order in the education section titled Risk Reduction Plan, in the comments section add the following (default select this new order for neuro and neuro surg and ensure this information is added to the AVS).     Additional Discharge Instructions    -You MUST take antibiotics prior to any dental procedures (including cleaning), invasive respiratory tract procedures and invasive skin procedures.  You should NOT have any of these procedure for 3 months after your valve surgery.  This will help to prevent infection to the heart valve.  Contact your primary care provider or cardiologist for an antibiotic prescription when needed.    -You have been prescribed Over-the-Counter medications.  These medications are very important for you to pick-up at the pharmacy and take them as directed.     Questions About Your Stay    For questions or concerns regarding your hospital stay. Call [phone]     Discharging attending physician: Arby Barrette [[id number]]      Appointment Request: Cardiac Rehab    PT TO START CARDIAC REHAB 1 WEEK AFTER DISCHARGE  Ordering of Outpatient Cardiac Rehabilitation includes:   - Outpatient Cardiac Rehabilitation per department protocol, 2-3x per week for up to a total of 36 sessions   - Initial evaluation including or baseline exercise testing  - Psychosocial, fitness, nutritional, medication, and risk factor assessment and education  - Development of individualized treatment plans (ITPs)  - If patient is diabetic, blood glucose testing as needed per departmental protocol    o Blood sugar monitoring and provision of 3 glucose tablets or ? 15 g carbohydrate snack for hypoglycemia based on   departmental protocol   - Initiation of all emergency protocols, as indicated per hospital and departmental policies, including, but not limited to    o Administration of nitroglycerin .4mg  sublingual for persistent, unstable, new-onset, or stable angina that doses not resolve  after 5 minutes of rest.  May repeat up to a total of 3 doses per protocol.    o Obtaining a 12-Lead EKG if indicated.    o Initiation and/or titration of O2 per departmental protocol for oxygen saturation <88% via pulse oximeter.   - Upon completion of the Cardiac Rehab program criteria, patient may continue into a maintenance program     Diagnosis Information: Valve Replacement    Pager # of the requesting provider if any questions? [phone]      Request for Cardiology Appointment   Standing Status: Future Standing Exp. Date: 07/17/28    S/p CABG/AVR 07/22/23 with Dr. Sindy Messing  1st choice pt's cardiologist, 2nd choice Fellow clinic or APP     Scheduling Priority: Routine    Schedule OV with (1st choice Provider) Danella Maiers M.D.    Will this patient be discharged with home health or to a continued care facility (SNF/Rehab)? No  Location of Appointment No Preference    NP/Nurse Provider/Diet Advanced Practice Provider (APP)       Current Discharge Medication List         START taking these medications    Details   aspirin 81 mg chewable tablet Chew one tablet by mouth daily. Indications: treatment to prevent a heart attack    PRESCRIPTION TYPE:  OTC      furosemide (LASIX) 40 mg tablet Take one tablet by mouth every morning for 7 days. Indications: visible water retention  Qty: 7 tablet, Refills: 0    PRESCRIPTION TYPE:  Normal      oxyCODONE-acetaminophen (PERCOCET) 5-325 mg tablet Take one tablet to two tablets by mouth every 6 hours as needed for Pain. Indications: pain  Qty: 30 tablet, Refills: 0    PRESCRIPTION TYPE:  Normal      polyethylene glycol 3350 (MIRALAX) 17 g packet Take one packet by mouth twice daily. While taking narcotic pain medication  Indications: constipation    PRESCRIPTION TYPE:  OTC      potassium chloride SR (KLOR-CON M20) 20 mEq tablet Take one tablet by mouth daily for 7 days. Indications: prevention of low potassium in the blood  Qty: 7 tablet, Refills: 0    PRESCRIPTION TYPE:  Normal      sennosides-docusate sodium (SENOKOT-S) 8.6/50 mg tablet Take two tablets by mouth twice daily. While taking narcotic pain medication  Indications: constipation    PRESCRIPTION TYPE:  OTC            CONTINUE these medications which have been CHANGED or REFILLED    Details   sotaloL (BETAPACE) 160 mg tablet Take one tablet by mouth every 12 hours. Indications: prevention of recurrent atrial fibrillation  Qty: 180 tablet, Refills: 3    PRESCRIPTION TYPE:  Normal            CONTINUE these medications which have NOT CHANGED    Details   acetaminophen (TYLENOL) 325 mg tablet Take two tablets by mouth every 4 hours as needed.  Refills: 0    PRESCRIPTION TYPE:  OTC      amoxicillin(+) (AMOXIL) 500 mg tablet 2g 1hr prior to procedure; 1gm 6hr after procedure  Qty: 6 tablet, Refills: 1    PRESCRIPTION TYPE:  Normal      apixaban (ELIQUIS) 5 mg tablet Take one tablet by mouth twice daily.    PRESCRIPTION TYPE:  Historical Med      atorvastatin (LIPITOR) 80 mg tablet Take one tablet by mouth daily. Indications: hardening of the arteries due to plaque buildup  Qty: 90 tablet, Refills: 3    PRESCRIPTION TYPE:  Normal      calcium carbonate/vitamin D-3 (OSCAL-500+D) 1250 mg/200 unit tablet Take one tablet by mouth twice daily. Calcium Carb 1250mg  delivers 500mg  elemental Ca    PRESCRIPTION TYPE:  Historical Med      empagliflozin (JARDIANCE) 25 mg tablet Take one tablet by mouth every morning.    PRESCRIPTION TYPE:  Historical Med      metFORMIN-XR (GLUCOPHAGE XR) 500 mg extended release tablet Take one tablet by mouth twice daily.    PRESCRIPTION TYPE:  Historical Med      RYBELSUS 14 mg tablet Take one tablet by mouth daily.    PRESCRIPTION TYPE:  Historical Med      tamsulosin (FLOMAX) 0.4 mg capsule Take one capsule by mouth at bedtime daily. PRESCRIPTION TYPE:  Historical Med  The following medications were removed from your list. This list includes medications discontinued this stay and those removed from your prior med list in our system        amoxicillin-potassium clavulanate (AUGMENTIN) 875/125 mg tablet        carvediloL (COREG) 3.125 mg tablet        nitroglycerin (NITROSTAT) 0.4 mg tablet               Scheduled appointments:      Aug 20, 2023 9:45 AM  Postoperative visit with Arby Barrette, MD  Cardiothoracic Surgery: [facility] ([facility]) [address]  [place], [address] [place] [address]  [phone]            Signed:  Vincent Gros, Cordelia Poche  07/29/2023      cc:  Primary Care Physician:  Wilford Grist     Referring physicians:  Vanice Sarah, MD   Additional provider(s):        [facility]    [phone]  Outside referral faxed: Date Faxed: 07/27/23  Internal referral sent to:

## 2023-07-19 ENCOUNTER — Encounter: Admit: 2023-07-19 | Discharge: 2023-07-19 | Payer: MEDICARE

## 2023-07-19 ENCOUNTER — Inpatient Hospital Stay: Admit: 2023-07-19 | Discharge: 2023-07-19 | Payer: MEDICARE

## 2023-07-19 ENCOUNTER — Ambulatory Visit: Admit: 2023-07-19 | Discharge: 2023-07-19 | Payer: MEDICARE

## 2023-07-19 ENCOUNTER — Ambulatory Visit: Admit: 2023-07-19 | Discharge: 2023-07-20 | Payer: MEDICARE

## 2023-07-19 NOTE — Progress Notes
5 Meter walks complete:     1st Trial = 5.35 seconds  2nd Trial = 4.94 seconds  3rd Trial = 5.6 seconds

## 2023-07-19 NOTE — Progress Notes
Pre-op education appt complete with patient and his wife, Lynnell Dike; who will be w/ pt on DOS.  Informed of current visitor policy. Reviewed and provided written pre-op instructions.  Instructed pt to be NPO at 2300 the noc before surgery, except for a sip of water to take any meds that the [facility] pharmacist instructs pt to take on the morning of surgery.  Verified that patient took LD of eliquis on 10/29, and LD of Jardiance on 10/31, as instructed.  As per written instructions, pt to take 2 preop showers w/ provided CHG 4% soap prior to coming to the hospital for surgery. Instructed to check in at [facility]missions at 0545 on DOS. Discussed process for pre-op, intra-op and Daylyn Azbill-op recovery.  Discussed Desarie Feild-op pain; lifting and driving restrictions; importance of mobilization and s/s of infection after discharge.  Instructed to call the [facility] office if having any health changes prior to surgery.  Pt verbalized understanding of all instructions.  Denies skin or dental issues; denies s/s UTI.  63M walks complete - see additional note.  Preop labs drawn in [facility] Lab.   Escorted pt to radiology for CXR.  All questions answered to the pt's satisfaction.  Escorted to [facility] Admitting for pre-registration and to [facility].  UA+ to be collected by [facility].   MP

## 2023-07-22 ENCOUNTER — Encounter: Admit: 2023-07-22 | Discharge: 2023-07-22 | Payer: MEDICARE

## 2023-07-22 ENCOUNTER — Inpatient Hospital Stay: Admit: 2023-07-22 | Discharge: 2023-07-22 | Payer: MEDICARE

## 2023-07-22 ENCOUNTER — Inpatient Hospital Stay: Admit: 2023-07-22 | Discharge: 2023-07-30 | Disposition: A | Discharge status: Home / Self Care | Payer: MEDICARE

## 2023-07-22 LAB — CBC
MCHC: 33 g/dL — ABNORMAL HIGH (ref 32.0–36.0)
MPV: 9.8 FL — ABNORMAL LOW (ref 7–11)
WBC COUNT: 10 10*3/uL (ref 4.5–11.0)

## 2023-07-22 LAB — POC HEMATOCRIT
HEMATOCRIT POC: 29 % — ABNORMAL LOW (ref 40–50)
HEMATOCRIT POC: 30 % — ABNORMAL LOW (ref 40–50)
HEMATOCRIT POC: 30 % — ABNORMAL LOW (ref 40–50)
HEMATOCRIT POC: 31 % — ABNORMAL LOW (ref 40–50)
HEMOGLOBIN POC: 10 g/dL — ABNORMAL LOW (ref 13.5–16.5)
HEMOGLOBIN POC: 10 g/dL — ABNORMAL LOW (ref 13.5–16.5)
HEMOGLOBIN POC: 9.9 g/dL — ABNORMAL LOW (ref 13.5–16.5)
HEMOGLOBIN POC: 10 g/dL — ABNORMAL LOW (ref 13.5–16.5)

## 2023-07-22 LAB — POC GLUCOSE
POC GLUCOSE: 222 mg/dL — ABNORMAL HIGH (ref 70–100)
POC GLUCOSE: 219 mg/dL — ABNORMAL HIGH (ref 70–100)
POC GLUCOSE: 193 mg/dL — ABNORMAL HIGH (ref 70–100)
POC GLUCOSE: 216 mg/dL — ABNORMAL HIGH (ref 70–100)
POC GLUCOSE: 191 mg/dL — ABNORMAL HIGH (ref 70–100)
POC GLUCOSE: 211 mg/dL — ABNORMAL HIGH (ref 70–100)
POC GLUCOSE: 180 mg/dL — ABNORMAL HIGH (ref 70–100)
POC GLUCOSE: 211 mg/dL — ABNORMAL HIGH (ref 70–100)
POC GLUCOSE: 160 mg/dL — ABNORMAL HIGH (ref 70–100)
POC GLUCOSE: 173 mg/dL — ABNORMAL HIGH (ref 70–100)
POC GLUCOSE: 152 mg/dL — ABNORMAL HIGH (ref 70–100)
POC GLUCOSE: 163 mg/dL — ABNORMAL HIGH (ref 70–100)
POC GLUCOSE: 215 mg/dL — ABNORMAL HIGH (ref 70–100)
POC GLUCOSE: 203 mg/dL — ABNORMAL HIGH (ref 70–100)
POC GLUCOSE: 186 mg/dL — ABNORMAL HIGH (ref 70–100)

## 2023-07-22 LAB — POC POTASSIUM
POTASSIUM, POC: 4.1 MMOL/L (ref 3.5–5.1)
POTASSIUM, POC: 4.8 MMOL/L (ref 3.5–5.1)
POTASSIUM, POC: 4.9 MMOL/L — ABNORMAL HIGH (ref 3.5–5.1)
POTASSIUM, POC: 5.1 MMOL/L — ABNORMAL HIGH (ref 3.5–5.1)
POTASSIUM, POC: 4.5 MMOL/L — ABNORMAL HIGH (ref 3.5–5.1)
POTASSIUM, POC: 4.4 MMOL/L (ref 3.5–5.1)

## 2023-07-22 LAB — POC BLOOD GAS ARTERIAL
BASE DEF ART POC: 3 MMOL/L
BASE DEF ART POC: 1 MMOL/L
BASE DEF ART POC: 2 MMOL/L — ABNORMAL LOW (ref 40–50)
BASE DEF ART POC: 2 MMOL/L — ABNORMAL LOW (ref 40–50)
BASE DEF ART POC: 1 MMOL/L — ABNORMAL LOW (ref 13.5–16.5)
BASE DEF ART POC: 1 MMOL/L — ABNORMAL LOW (ref 40–50)
BASE DEF ART POC: 3 MMOL/L — ABNORMAL LOW (ref 40–50)
BASE DEF ART POC: 3 MMOL/L
BICARB, ART POC: 22 MMOL/L — ABNORMAL LOW (ref 21–28)
BICARB, ART POC: 25 MMOL/L — ABNORMAL LOW (ref 21–28)
BICARB, ART POC: 23 MMOL/L (ref 21–28)
BICARB, ART POC: 23 MMOL/L (ref 21–28)
BICARB, ART POC: 24 MMOL/L (ref 21–28)
BICARB, ART POC: 24 MMOL/L (ref 21–28)
BICARB, ART POC: 23 MMOL/L (ref 21–28)
BICARB, ART POC: 24 MMOL/L (ref 21–28)
BICARB, ART POC: 23 MMOL/L (ref 21–28)
BICARB, ART POC: 22 MMOL/L (ref 21–28)
BICARB, ART POC: 23 MMOL/L (ref 21–28)
O2 SAT, ART POC: 100 % — ABNORMAL HIGH (ref 95–99)
O2 SAT, ART POC: 100 % — ABNORMAL HIGH (ref 95–99)
O2 SAT, ART POC: 100 % — ABNORMAL HIGH (ref 95–99)
O2 SAT, ART POC: 100 % — ABNORMAL HIGH (ref 95–99)
O2 SAT, ART POC: 100 % — ABNORMAL HIGH (ref 95–99)
O2 SAT, ART POC: 100 % — ABNORMAL HIGH (ref 95–99)
O2 SAT, ART POC: 100 % — ABNORMAL HIGH (ref 95–99)
O2 SAT, ART POC: 97 % (ref 95–99)
PCO2, ART POC: 43 mmHg (ref 35–45)
PCO2, ART POC: 50 mmHg — ABNORMAL HIGH (ref 35–45)
PCO2, ART POC: 39 mmHg (ref 35–45)
PCO2, ART POC: 43 mmHg (ref 35–45)
PCO2, ART POC: 40 mmHg (ref 35–45)
PCO2, ART POC: 41 mmHg (ref 35–45)
PCO2, ART POC: 30 mmHg — ABNORMAL LOW (ref 35–45)
PCO2, ART POC: 43 mmHg (ref 35–45)
PCO2, ART POC: 45 mmHg (ref 35–45)
PCO2, ART POC: 42 mmHg (ref 35–45)
PCO2, ART POC: 48 mmHg — ABNORMAL HIGH (ref 35–45)
PH, ART POC: 7.3 — ABNORMAL LOW (ref 7.35–7.45)
PH, ART POC: 7.3 (ref 7.35–7.45)
PH, ART POC: 7.3 (ref 7.35–7.45)
PH, ART POC: 7.3 (ref 7.35–7.45)
PH, ART POC: 7.3 (ref 7.35–7.45)
PH, ART POC: 7.3 (ref 7.35–7.45)
PH, ART POC: 7.3 — ABNORMAL LOW (ref 7.35–7.45)
PH, ART POC: 7.3 — ABNORMAL LOW (ref 7.35–7.45)
PH, ART POC: 7.5 — ABNORMAL HIGH (ref 7.35–7.45)
PH, ART POC: 7.3 — ABNORMAL LOW (ref 7.35–7.45)
PH, ART POC: 7.3 — ABNORMAL LOW (ref 7.35–7.45)
PO2, ART POC: 231 mmHg — ABNORMAL HIGH (ref 80–100)
PO2, ART POC: 497 mmHg — ABNORMAL HIGH (ref 80–100)
PO2, ART POC: 467 mmHg — ABNORMAL HIGH (ref 80–100)
PO2, ART POC: 481 mmHg — ABNORMAL HIGH (ref 80–100)
PO2, ART POC: 595 mmHg — ABNORMAL HIGH (ref 80–100)
PO2, ART POC: 219 mmHg — ABNORMAL HIGH (ref 80–100)
PO2, ART POC: 228 mmHg — ABNORMAL HIGH (ref 80–100)
PO2, ART POC: 94 mmHg — ABNORMAL LOW (ref 80–100)

## 2023-07-22 LAB — POC IONIZED CALCIUM
IONIZED CALCIUM,POC: 1.2 MMOL/L (ref 1.0–1.3)
IONIZED CALCIUM,POC: 1 MMOL/L (ref 1.0–1.3)
IONIZED CALCIUM,POC: 1.1 MMOL/L (ref 1.0–1.3)
IONIZED CALCIUM,POC: 1.1 MMOL/L (ref 1.0–1.3)
IONIZED CALCIUM,POC: 1.1 MMOL/L (ref 1.0–1.3)
IONIZED CALCIUM,POC: 1.1 MMOL/L (ref 1.0–1.3)
IONIZED CALCIUM,POC: 1.1 MMOL/L (ref 1.0–1.3)
IONIZED CALCIUM,POC: 1.2 MMOL/L (ref 1.0–1.3)
IONIZED CALCIUM,POC: 1.1 MMOL/L (ref 1.0–1.3)
IONIZED CALCIUM,POC: 1.2 MMOL/L (ref 1.0–1.3)

## 2023-07-22 LAB — POC SODIUM
SODIUM, POC: 138 MMOL/L (ref 137–147)
SODIUM, POC: 139 MMOL/L (ref 137–147)
SODIUM, POC: 139 MMOL/L (ref 137–147)
SODIUM, POC: 140 MMOL/L (ref 137–147)
SODIUM, POC: 139 MMOL/L (ref 137–147)
SODIUM, POC: 141 MMOL/L (ref 137–147)
SODIUM, POC: 140 MMOL/L (ref 137–147)
SODIUM, POC: 141 MMOL/L (ref 137–147)
SODIUM, POC: 142 MMOL/L (ref 137–147)
SODIUM, POC: 141 MMOL/L (ref 137–147)
SODIUM, POC: 142 MMOL/L (ref 137–147)

## 2023-07-22 LAB — BASIC METABOLIC PANEL
ANION GAP: 6 pg (ref 3–12)
BLD UREA NITROGEN: 25 mg/dL (ref 7–25)
CREATININE: 0.8 mg/dL — ABNORMAL LOW (ref 0.4–1.24)
EGFR: >60 mL/min (ref >60)
SODIUM: 139 MMOL/L — ABNORMAL LOW (ref 137–147)

## 2023-07-22 LAB — MAGNESIUM: MAGNESIUM: 2.6 mg/dL (ref 1.6–2.6)

## 2023-07-22 LAB — O2 SATURATION, MIXED VENOUS
O2 SAT, MIXED VEN: 65 %
O2 SAT, MIXED VEN: 68 %

## 2023-07-22 LAB — PROTIME INR (PT)
INR: 1.1 MMOL/L — ABNORMAL HIGH (ref 0.9–1.2)
PROTIME: 12 s — ABNORMAL LOW (ref 10.2–12.9)

## 2023-07-22 MED ORDER — BISACODYL 10 MG RE SUPP
10 mg | Freq: Every day | RECTAL | 0 refills | Status: DC | PRN
Start: 2023-07-22 — End: 2023-07-30
  Administered 2023-07-26: 12:00:00 10 mg via RECTAL

## 2023-07-22 MED ORDER — NOREPINEPHRINE BITARTRATE-D5W 4 MG/250 ML (16 MCG/ML) IV SOLN
0-.5 ug/kg/min | INTRAVENOUS | 0 refills | Status: DC
Start: 2023-07-22 — End: 2023-07-25
  Administered 2023-07-23: 19:00:00 0.03 ug/kg/min via INTRAVENOUS

## 2023-07-22 MED ORDER — HYDRALAZINE 20 MG/ML IJ SOLN
10 mg | INTRAVENOUS | 0 refills | Status: DC | PRN
Start: 2023-07-22 — End: 2023-07-30

## 2023-07-22 MED ORDER — NITROGLYCERIN IN 5 % DEXTROSE 50 MG/250 ML (200 MCG/ML) IV SOLN
.1-3 ug/kg/min | INTRAVENOUS | 0 refills | Status: DC
Start: 2023-07-22 — End: 2023-07-22

## 2023-07-22 MED ORDER — FENTANYL CITRATE (PF) 50 MCG/ML IJ SOLN
25-50 ug | INTRAVENOUS | 0 refills | Status: DC | PRN
Start: 2023-07-22 — End: 2023-07-25
  Administered 2023-07-22 – 2023-07-24 (×5): 50 ug via INTRAVENOUS

## 2023-07-22 MED ORDER — ASPIRIN 81 MG PO CHEW
81 mg | Freq: Every day | ORAL | 0 refills | Status: DC
Start: 2023-07-22 — End: 2023-07-30
  Administered 2023-07-23 – 2023-07-29 (×8): 81 mg via ORAL

## 2023-07-22 MED ORDER — LIDOCAINE (PF) 10 MG/ML (1 %) IJ SOLN
.2 mL | INTRAMUSCULAR | 0 refills | Status: DC | PRN
Start: 2023-07-22 — End: 2023-07-22

## 2023-07-22 MED ORDER — POLYETHYLENE GLYCOL 3350 17 GRAM PO PWPK
1 | Freq: Two times a day (BID) | ORAL | 0 refills | Status: DC
Start: 2023-07-22 — End: 2023-07-30
  Administered 2023-07-23 – 2023-07-26 (×7): 17 g via ORAL

## 2023-07-22 MED ORDER — OXYCODONE 5 MG PO TAB
5-10 mg | ORAL | 0 refills | Status: DC | PRN
Start: 2023-07-22 — End: 2023-07-30
  Administered 2023-07-23 – 2023-07-24 (×9): 10 mg via ORAL
  Administered 2023-07-25: 14:00:00 5 mg via ORAL
  Administered 2023-07-25: 03:00:00 10 mg via ORAL

## 2023-07-22 MED ORDER — NICARDIPINE IN NACL (ISO-OS) 20 MG/200 ML (0.1 MG/ML) IV PGBK
5-15 mg/h | INTRAVENOUS | 0 refills | Status: DC
Start: 2023-07-22 — End: 2023-07-23

## 2023-07-22 MED ORDER — MAGNESIUM OXIDE 400 MG (241.3 MG MAGNESIUM) PO TAB
200-600 mg | ORAL | 0 refills | Status: DC | PRN
Start: 2023-07-22 — End: 2023-07-30
  Administered 2023-07-24 – 2023-07-29 (×4): 200 mg via ORAL

## 2023-07-22 MED ORDER — POTASSIUM CHLORIDE 20 MEQ PO TBTQ
20-40 meq | ORAL | 0 refills | Status: DC | PRN
Start: 2023-07-22 — End: 2023-07-30
  Administered 2023-07-24 – 2023-07-29 (×7): 20 meq via ORAL

## 2023-07-22 MED ORDER — SODIUM CHLORIDE 0.9% IV SOLP
INTRAVENOUS | 0 refills | Status: DC
Start: 2023-07-22 — End: 2023-07-22
  Administered 2023-07-22: 13:00:00 1000.0000 mL via INTRAVENOUS

## 2023-07-22 MED ORDER — ONDANSETRON HCL (PF) 4 MG/2 ML IJ SOLN
4 mg | INTRAVENOUS | 0 refills | Status: DC | PRN
Start: 2023-07-22 — End: 2023-07-30

## 2023-07-22 MED ORDER — METOPROLOL TARTRATE 25 MG PO TAB
12.5 mg | Freq: Once | ORAL | 0 refills | Status: DC
Start: 2023-07-22 — End: 2023-07-22

## 2023-07-22 MED ORDER — SENNOSIDES-DOCUSATE SODIUM 8.6-50 MG PO TAB
2 | Freq: Two times a day (BID) | ORAL | 0 refills | Status: DC
Start: 2023-07-22 — End: 2023-07-30
  Administered 2023-07-23 – 2023-07-27 (×8): 2 via ORAL

## 2023-07-22 MED ORDER — ONDANSETRON 4 MG PO TBDI
4 mg | ORAL | 0 refills | Status: DC | PRN
Start: 2023-07-22 — End: 2023-07-30

## 2023-07-22 MED ORDER — DEXTROSE 50 % IN WATER (D50W) IV SYRG
12.5-25 g | INTRAVENOUS | 0 refills | Status: DC | PRN
Start: 2023-07-22 — End: 2023-07-30

## 2023-07-22 MED ORDER — ATORVASTATIN 40 MG PO TAB
80 mg | Freq: Every evening | ORAL | 0 refills | Status: DC
Start: 2023-07-22 — End: 2023-07-30
  Administered 2023-07-24 – 2023-07-29 (×6): 80 mg via ORAL

## 2023-07-22 MED ORDER — INSULIN REGULAR IN 0.9 % NACL 100 UNIT/100 ML (1 UNIT/ML) IV SOLN
1-32 [IU]/h | INTRAVENOUS | 0 refills | Status: DC
Start: 2023-07-22 — End: 2023-07-24
  Administered 2023-07-23: 19:00:00 2.5 [IU]/h via INTRAVENOUS

## 2023-07-22 MED ORDER — MAGNESIUM HYDROXIDE 400 MG/5 ML PO SUSP
30 mL | Freq: Every day | ORAL | 0 refills | Status: DC | PRN
Start: 2023-07-22 — End: 2023-07-30
  Administered 2023-07-26: 12:00:00 30 mL via ORAL

## 2023-07-22 MED ORDER — ACETAMINOPHEN 500 MG PO TAB
1000 mg | ORAL | 0 refills | Status: AC
Start: 2023-07-22 — End: ?
  Administered 2023-07-23 – 2023-07-26 (×11): 1000 mg via ORAL

## 2023-07-22 MED ORDER — MAGNESIUM SULFATE IN D5W 1 GRAM/100 ML IV PGBK
1 g | INTRAVENOUS | 0 refills | Status: DC | PRN
Start: 2023-07-22 — End: 2023-07-30

## 2023-07-22 MED ORDER — DEXMEDETOMIDINE IN 0.9 % NACL 400 MCG/100 ML (4 MCG/ML) IV SOLN
.2-1 ug/kg/h | INTRAVENOUS | 0 refills | Status: DC
Start: 2023-07-22 — End: 2023-07-23

## 2023-07-22 MED ORDER — POTASSIUM CHLORIDE 20 MEQ/15 ML PO LIQD
20-40 meq | NASOGASTRIC | 0 refills | Status: DC | PRN
Start: 2023-07-22 — End: 2023-07-30

## 2023-07-22 MED ORDER — ACETAMINOPHEN 325 MG PO TAB
650 mg | ORAL | 0 refills | Status: DC | PRN
Start: 2023-07-22 — End: 2023-07-30
  Administered 2023-07-28: 02:00:00 650 mg via ORAL

## 2023-07-22 MED ORDER — LIDOCAINE (PF) 100 MG/5 ML (2 %) IV SYRG
1 mg/kg | INTRAVENOUS | 0 refills | Status: DC | PRN
Start: 2023-07-22 — End: 2023-07-25

## 2023-07-22 MED ORDER — POTASSIUM CHLORIDE IN WATER 10 MEQ/50 ML IV PGBK
10 meq | INTRAVENOUS | 0 refills | Status: DC | PRN
Start: 2023-07-22 — End: 2023-07-30
  Administered 2023-07-23 (×2): 10 meq via INTRAVENOUS

## 2023-07-22 MED ORDER — SODIUM CHLORIDE 0.9% IV SOLP
0 refills | Status: CP
Start: 2023-07-22 — End: ?

## 2023-07-22 MED ORDER — ACETAMINOPHEN 650 MG RE SUPP
650 mg | RECTAL | 0 refills | Status: DC | PRN
Start: 2023-07-22 — End: 2023-07-30

## 2023-07-22 MED ORDER — PANTOPRAZOLE 40 MG PO TBEC
40 mg | Freq: Every day | ORAL | 0 refills | Status: DC
Start: 2023-07-22 — End: 2023-07-23

## 2023-07-22 MED ORDER — AMIODARONE 200 MG PO TAB
400 mg | Freq: Two times a day (BID) | ORAL | 0 refills | Status: DC
Start: 2023-07-22 — End: 2023-07-23

## 2023-07-22 MED ORDER — MAGNESIUM SULFATE IN WATER 4 GRAM/50 ML (8 %) IV PGBK
4 g | Freq: Once | INTRAVENOUS | 0 refills | Status: CP
Start: 2023-07-22 — End: ?
  Administered 2023-07-22: 4 g via INTRAVENOUS

## 2023-07-22 MED ORDER — HEPARIN 1000 UNITS IN PLASMALYTE-A 50 ML IRR(OR)(BATCHED)
0 refills | Status: DC
Start: 2023-07-22 — End: 2023-07-22

## 2023-07-22 MED ORDER — VANCOMYCIN 1,000 MG IV SOLR
0 refills | Status: DC
Start: 2023-07-22 — End: 2023-07-22

## 2023-07-22 MED ORDER — ATROPINE 0.1 MG/ML IJ SYRG
.5 mg | Freq: Once | INTRAVENOUS | 0 refills | Status: AC | PRN
Start: 2023-07-22 — End: ?

## 2023-07-22 MED ORDER — PANTOPRAZOLE 40 MG IV SOLR
40 mg | Freq: Every day | INTRAVENOUS | 0 refills | Status: DC
Start: 2023-07-22 — End: 2023-07-23
  Administered 2023-07-23: 02:00:00 40 mg via INTRAVENOUS

## 2023-07-22 MED ORDER — ALUM-MAG HYDROXIDE-SIMETH 200-200-20 MG/5 ML PO SUSP
30 mL | ORAL | 0 refills | Status: DC | PRN
Start: 2023-07-22 — End: 2023-07-30

## 2023-07-22 MED ORDER — LIDOCAINE 5 % TP PTMD
1 | Freq: Every day | TOPICAL | 0 refills | Status: DC
Start: 2023-07-22 — End: 2023-07-30
  Administered 2023-07-23 – 2023-07-26 (×5): 1 via TOPICAL

## 2023-07-22 MED ORDER — CEFAZOLIN 2 GRAM IV SOLR
2 g | INTRAVENOUS | 0 refills | Status: CP
Start: 2023-07-22 — End: ?
  Administered 2023-07-23 (×3): 2 g via INTRAVENOUS

## 2023-07-22 MED ORDER — AMINOCAPROIC ACID INFUSION
2 g/h | INTRAVENOUS | 0 refills | Status: AC
Start: 2023-07-22 — End: ?

## 2023-07-22 MED ADMIN — INSULIN REGULAR HUMAN 100 UNIT/ML IJ SOLN [80719]: 2 [IU]/h | INTRAVENOUS | @ 14:00:00 | Stop: 2023-07-22 | NDC 54029286109

## 2023-07-22 MED ADMIN — AMINOCAPROIC ACID 250 MG/ML IV SOLN [403]: 1 g/h | INTRAVENOUS | @ 14:00:00 | Stop: 2023-07-22 | NDC 00409434616

## 2023-07-22 MED ADMIN — PHENYLEPHRINE HCL 10 MG/ML IJ SOLN [6242]: .5 ug/kg/min | INTRAVENOUS | @ 13:00:00 | Stop: 2023-07-22 | NDC 70121157701

## 2023-07-22 MED ADMIN — NOREPINEPHRINE BITARTRATE-D5W 4 MG/250 ML (16 MCG/ML) IV SOLN [173410]: .06 ug/kg/min | INTRAVENOUS | @ 14:00:00 | Stop: 2023-07-22 | NDC 00338011220

## 2023-07-22 MED ADMIN — SODIUM CHLORIDE 0.9% IV SOLP [27838]: 2 [IU]/h | INTRAVENOUS | @ 14:00:00 | Stop: 2023-07-22 | NDC 00338004938

## 2023-07-22 MED ADMIN — SODIUM CHLORIDE 0.9% IV SOLP [27838]: .5 ug/kg/min | INTRAVENOUS | @ 13:00:00 | Stop: 2023-07-22 | NDC 00338004902

## 2023-07-22 MED ADMIN — CEFAZOLIN 10 G/150 ML NACL (BATCHED) [214320]: 2 g | INTRAVENOUS | @ 14:00:00 | Stop: 2023-07-22 | NDC 54029489609

## 2023-07-22 MED ADMIN — NICARDIPINE IN NACL (ISO-OS) 20 MG/200 ML (0.1 MG/ML) IV PGBK [169819]: 5 mg/h | INTRAVENOUS | @ 22:00:00 | Stop: 2023-07-22 | NDC 43066000910

## 2023-07-22 MED ADMIN — DEXMEDETOMIDINE IN 0.9 % NACL 400 MCG/100 ML (4 MCG/ML) IV SOLN [317250]: .7 ug/kg/h | INTRAVENOUS | @ 20:00:00 | Stop: 2023-07-22 | NDC 43066055712

## 2023-07-22 MED ADMIN — SODIUM CHLORIDE 0.9% IV SOLP [27838]: 1 g/h | INTRAVENOUS | @ 14:00:00 | Stop: 2023-07-22 | NDC 00338004904

## 2023-07-22 NOTE — Progress Notes
[facility] Nursing Progress Note  ?  Neuro status is Oriented X4: No, Please explain: sedated/intubated, FC and MAE  Mobility: bedrest    Any Pacing requirements/episodes: No    Change in vasopressor requirements :  yes see MAR    CI > 2: Yes  CVP < 10: Yes    Chest Tube output: >50 ml/hr    Estimated average urine output last 4 hours (ml/hr): ~ 75 ml/hr    Acute Changes in patient status/interventions: out @ 1500

## 2023-07-22 NOTE — Interval H&P Note
History and Physical Update Note    Allergies:  Celebrex [celecoxib]    Lab/Radiology/Other Diagnostic Tests:  Hematology:    Lab Results   Component Value Date    HGB 14.2 07/19/2023    HCT 42.8 07/19/2023    PLTCT 189 07/19/2023    WBC 6.2 07/19/2023    NEUT 64.2 06/05/2023    ANC 3.40 06/05/2023    ALC 1.16 06/05/2023    MONA 8.1 06/05/2023    AMC 0.43 06/05/2023    EOSA 1 10/22/2005    ABC 0.05 06/05/2023    MCV 95.7 07/19/2023    MCH 31.7 07/19/2023    MCHC 33.1 07/19/2023    MPV 10.1 07/19/2023    RDW 14.0 07/19/2023   , Basic Chemistry:   Lab Results   Component Value Date    NA 137 07/19/2023    K 4.3 07/19/2023    CL 102 07/19/2023    CO2 27 07/19/2023    BUN 19 07/19/2023    CR 1.08 07/19/2023    GLU 213 07/19/2023    GLU 140 10/23/2005    CA 8.8 07/19/2023   , Mg and PO4:   Lab Results   Component Value Date    MG 2.4 10/23/2005    PO4 2.6 10/22/2005   , and HgbA1C:   Lab Results   Component Value Date    HGBA1C 7.4 06/10/2023     Point of Care Testing:  (Last 24 hours):  POC Glucose (Download): (!) 222 (07/22/23 0623)     Nutrition: No Dietitian Consult  Wound: No Wound Consult      I have examined the patient, and there are no significant changes in their condition, from the previous H&P performed on 07/02/23. Patient to undergo Coronary artery bypass grafting, left internal mammary artery take down, endoscopic vein harvest, Aortic Valve Replacement- tissue, possible left atrial appendage ligation today with Dr. Sindy Messing as planned. Valve types discussed with the patient and a tissue valve will be used. Patients states that Dr. Sindy Messing explained the procedure and risk to patients satisfaction. Pertinent preoperative testing including history, labs and imaging reviewed. Patient denies any new symptoms or recent exposure to COVID-19. Patient understands procedure and all questions were answered. Surgical/Blood consents are signed and in chart. NPO since midnight. Pre-Surgical scrubs complete. Last dose of eliquis 07/16/23. Last ETOH use 07/18/23.    Darius Brooks, APRN-NP  Available on Voalte     --------------------------------------------------------------------------------------------------------------------------------------------

## 2023-07-22 NOTE — Progress Notes
I saw this patient today upon admission to the CTICU. I have reviewed all pertinent data, including labs, operative reports, radiology, vital signs, neurologic assessments, nursing notes, respiratory therapy data, intake and output, and consult notes and have devised a plan based on the aforementioned data. This patient is critically ill and is at risk for life threatening deterioration necessitating complex medical decision making and ongoing provision of ICU care for close monitoring.      Critical Care Time: 45 minutes  Date of Service: 07/22/2023    Brief Patient Summary: 35M w/ HTN, T2DM, paroxysmal Afib, severe multivessel CAD, severe AS/mod AI now s/p CABGx3, tAVR, and LAAL. He presents to the CTICU intubated and sedated. Reportedly patient consumes several drinks per night PTA w/o reported hx of w/d symptoms.    Neuro:  Sedation while intubated  PRN pain medications   Delirium precautions  AWAS monitoring    CV: SR 70s, MAP goal >65. Cont bASA, statin, ppx amio. Hold BB in immediate post-op setting.     Pulmonary:  Lung protective ventilation   WTE   Aggressive pulm toilet once extubated.    Renal:  Strict I&O. Baseline Scr 0.9.     ID:  Post op ppx     GI:  NPO   ADAT once extubated     Heme:  Ppx per CTS   Monitor Hgb and transfuse <7    Endocrine:  Insulin per [facility] protocol     Activity: PT/OT once extubated    Judy Pimple  Anesthesiology/Critical Care Medicine

## 2023-07-22 NOTE — Unmapped
Brief Operative Note    Name: Darius Cherry is a 72 y.o. 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    Surgeons and Role:     * Zorn, Seward Carol, MD - Primary     Nila Nephew, Tonny Branch, PA-C - Assisting     * Wynema Birch, MD - Fellow    Findings:    Grafted LIMA-LAD, reasonable caliber target just distal to stent, ~ 2.55mm vessel, RSVG-OM2 2.5 mm vessel, RSVG-RPDA - 2 mm vessel  Aortic valve significantly calcified, replaced with 25mm EPIC valve. No paravalvular leak, no AI. Function at the end of the case normal. Leaving OR on 0.04 of norepi.      Estimated Blood Loss: No blood loss documented.     Specimen(s) Removed/Disposition:   ID Type Source Tests Collected by Time Destination   1 : Aortic Valve Leaflets for Permanent Tissue Aorta SURGICAL PATHOLOGY          Arby Barrette, MD 07/22/2023 1135        Complications:  None      Implants:   Implant Name Serial No. Manufacturer Lot No. LRB No. Used Action   Device Occlusion With 45mm Preloaded VClip For LAA Exclusion System AtriClip Flex V - SN/A N/A [employer] [id number] N/A 1 Implanted   Max Stented Tissue Valve 25mm - [id number] [employer] N/A N/A 1 Implanted       Drains: Details on drains available on LDA report    Disposition:  ICU - stable    Wynema Birch, MD

## 2023-07-22 NOTE — Progress Notes
CARDIOPULMONARY REHABILITATION  INPATIENT ASSESSMENT    Cardiac Rehabilitation Staff: Eulis Manly Discharge Date:     Demographics  Pre-admit Dx: Coronary Artery Disease Date of Admission: 07/22/2023     Room: [facility] CV OR RM/HC3 CV OR BD DOB:  [birth year]   Insurance: Primary: Medicare A&B Secondary: Aetna Supplement   Address: [address]  [place] [address]   Patient Phone:  [phone] (home)        ED Contact: Karee Fausey (wife)  ED Phone #: [phone]   CTS: Dr. Earlene Plater Cardiologist: Danella Maiers     Cardiac Procedures and Events  CABG: 07/22/23       Risk Factors  Risk Factors: Hypertension, Hyperlipidemia, Obesity, Diabetes-type II  BP: 132/79  Height: 180.3 cm (5' 11)  Weight: 91.6 kg (201 lb 15.1 oz)  BMI (Calculated): 28.16      Medical History   has a past medical history of Aortic valve stenosis, Atrial fibrillation (HCC), CAD (coronary artery disease) (04/26/2009), DM (diabetes mellitus) (HCC), Fracture (2016), History of coronary artery stent placement (2007/ 2013), Hyperlipemia (04/26/2009), Hypertension (04/26/2009), Murmur, cardiac, Myocardial infarction (HCC) (2007), and Skin cancer.    Labs  Cholesterol   Date Value Ref Range Status   06/10/2023 138 <200 MG/DL Final     Triglycerides   Date Value Ref Range Status   06/10/2023 69 <150 MG/DL Final     HDL   Date Value Ref Range Status   06/10/2023 55 >40 MG/DL Final     LDL   Date Value Ref Range Status   06/10/2023 76 <100 mg/dL Final     Hemoglobin G6Y   Date Value Ref Range Status   06/10/2023 7.4 (H) 4.0 - 5.7 % Final     Comment:     The ADA recommends that most patients with type 1 and type 2 diabetes maintain   an A1c level <7%.       Troponin-I   Date Value Ref Range Status   10/22/2005 2.94 (H) 0.0 - 0.05 NG/ML Final         Heart Resource Manual Given:       Teaching Completed:       Outpatient Cardiopulmonary Rehabilitation    Outpatient Cardic Rehab:      Referral Faxed to:       Date Faxed:      Location:      If [place], Sent to Staff:       Eulis Manly, RN  07/22/2023

## 2023-07-22 NOTE — Progress Notes
All belongings gathered and placed in belonging bag with patient labels at bedside.  The bag(s) contain(s) the following:    Clothing: shirt, jeans  Shoes: one pair of tennis shoes  Jewelry: none  Identification/Driver's License: No  Cash: none  Credit Cards: none  Electronics: none  Dentures/Glasses/Hearing aids: glasses given to spouse  Insulin Pump/Continuous Glucose Monitor: none  Assistive devices:  none  Other: none    All belongings placed in 1 bag(s).    Belongings disposition:  belongings taken to HC310 with patient at bedside.

## 2023-07-22 NOTE — Operative Report(Direct Entry)
OPERATIVE REPORT    Name: Laureano Collin is a 72 y.o. male     DOB: 03-19-1951             MRN#: [id number]    DATE OF OPERATION: 07/22/2023    Surgeons and Role:     * Arby Barrette, MD - Primary     Cathie Hoops, PA-C - Assisting (participated in vein harvest and closure of sternum and soft tissues)     * Wynema Birch, MD - Fellow (participated in all aspects of the procedure from beginning to end)        Preoperative Diagnosis:    Nonrheumatic aortic valve stenosis [I35.0]  Paroxysmal atrial fibrillation (HCC) [I48.0]  Coronary artery disease involving native coronary artery of native heart without angina pectoris [I25.10]    Post-op Diagnosis      * Nonrheumatic aortic valve stenosis [I35.0]     * Paroxysmal atrial fibrillation (HCC) [I48.0]     * Coronary artery disease involving native coronary artery of native heart without angina pectoris [I25.10]    Procedure(s):  Coronary artery bypass grafting x3, left internal mammary artery take down, endoscopic vein harvest, Aortic Valve Replacement- tissue, left atrial appendage ligation.     LIMA to LAD, reverse saphenous vein graft to OM 2, reverse saphenous vein graft to left posterior lateral vessel          Description and Findings of Operative Procedure:     After informed consent patient was brought the operating room placed supine on the table.  Arterial venous lines were placed paresthesia.  General endotracheal anesthesia was induced and the patient was prepped and draped in usual sterile fashion.    Midline sternotomy incision was made and Bovie cautery was used to extend through the soft tissues.  Sternum was divided the midline.  Left hemithorax was elevated and the left internal mammary artery was harvested in a pedicled fashion using Bovie cautery and clips for the side branches.  This was a good quality conduit with excellent flow at its division.  Was clipped distally soaked in papaverine and return to the left chest.    Concurrently greater saphenous vein was harvested from the left leg using endoscopic techniques.  This was a thin-walled vessel and uniform in diameter.  Overall an excellent quality conduit.    Pericardium was opened and suspended.  Systemic heparinization was achieved.  Arterial venous cannulas were placed.  Antegrade and retrograde cardioplegia catheters were secured.  Cardiopulmonary bypass was initiated.  Aortic cross-clamp was placed and warm blood cardioplegia was administered in antegrade fashion followed by cold blood cardioplegia.  There was prompt electromechanical arrest.  Intermittent doses of cardioplegia were administered 15 to 20 minutes throughout remainder the procedure.  CO2 insufflation was used to aid in de-airing.  Vent was placed through the right superior pulmonary vein.    Heart restart exposing the distal right coronary circulation.  The PDA was relatively small target.  Posterior lateral branch of the left side was a better quality target approximately 2 to 2-1/2 mm in diameter.  End to side anastomosis was created in the proximal was ultimately constructed to the right side of the aorta.    The heart was then turned exposing the lateral wall.  Left atrial appendage was ligated space using an Atriclip device.  The second marginal was a good quality target approximately 3 mm in diameter.  An end to side anastomosis was created using reverse saphenous vein  graft and the proximal was ultimately constructed the left side of the aorta.    Transverse aortotomy was made extending down into the noncoronary sinus in a hockey-stick fashion.  We had good visualization of a 3 leaflet aortic valve that was heavily calcified.  The leaflets were excised in their entirety and the annulus was aggressively debrided.  Nonpledgeted 2-0 Ethibond sutures were placed in a noneverting fashion around the annulus and brought through the sewing ring of a 25 mm epic tissue valve.    The valve seated and secured nicely.  Extensive de-airing maneuvers performed and the aortotomy was closed.  Continued efforts to tear were made.  Aortic cross-clamp was released and there was prompt return of normal sinus rhythm without cardioversion.  Patient was weaned from cardiopulmonary bypass without difficulty.  TEE demonstrated good valve function with no paravalvular or transvalvular leak.  Right and left ventricular function was preserved.    Protamine was administered and the venous and arterial cannulas were removed.  Once were comfortable hemostasis 2 mediastinal chest tubes were placed and a left pleural drain was placed.  Ventricular pacing wire was secured.  Sternal and soft tissue closure standard.  Patient returned the ICU in stable condition.  All sponge, needle and instrument counts were correct per report.            Estimated Blood Loss:  NA    Specimen(s) Removed/Disposition:   ID Type Source Tests Collected by Time Destination   1 : Aortic Valve Leaflets for Permanent Tissue Aorta SURGICAL PATHOLOGY          Arby Barrette, MD 07/22/2023 1135        Attestation: I performed this procedure with a resident.    Complications:  None      Implants:   Implant Name Serial No. Manufacturer Lot No. LRB No. Used Action   Device Occlusion With 45mm Preloaded VClip For LAA Exclusion System AtriClip Flex V - SN/A N/A [employer] [id number] N/A 1 Implanted   Max Stented Tissue Valve 25mm - [id number] [employer] N/A N/A 1 Implanted       Drains: Details on drains available on LDA report    Disposition:  ICU - stable          Arby Barrette, MD  Pager

## 2023-07-23 ENCOUNTER — Inpatient Hospital Stay: Admit: 2023-07-23 | Discharge: 2023-07-23 | Payer: MEDICARE

## 2023-07-23 LAB — ECG 12-LEAD
P AXIS: 69 degrees
QTC CALCULATION (BAZETT): 499 ms
R AXIS: 62 degrees
T AXIS: 22 degrees

## 2023-07-23 LAB — TYPE & CROSSMATCH
ANTIBODY SCREEN: NEGATIVE
UNITS ORDERED: 0

## 2023-07-23 LAB — O2 SATURATION, MIXED VENOUS
O2 SAT, MIXED VEN: 57 %
O2 SAT, MIXED VEN: 64 %
O2 SAT, MIXED VEN: 68 %
O2 SAT, MIXED VEN: 56 %

## 2023-07-23 LAB — BLOOD GASES, ARTERIAL
BASE DEFICIT-ART: 3.4 MMOL/L
BICARB, ART(CAL): 21 MMOL/L (ref 21–28)
O2 SAT,ART(CALC.): 98 % (ref 95–99)
O2 SAT,ART(CALC.): 98 % (ref 95–99)
PCO2-ART: 42 mmHg (ref 35–45)
PCO2-ART: 38 mmHg (ref 35–45)
PH-ART: 7.3 — ABNORMAL LOW (ref 7.35–7.45)
PH-ART: 7.3 (ref 7.35–7.45)
PO2-ART: 113 mmHg — ABNORMAL HIGH (ref 80–100)
PO2-ART: 111 mmHg — ABNORMAL HIGH (ref 80–100)

## 2023-07-23 LAB — PREPARE APHERESIS PLATELETS
BLOOD EXPIRATION DATE: 202
CODING STATUS: 510
ISSUE DATE TIME: 202
PRODUCT CODE: 0
UNIT DIVISION: 0
UNITS ORDERED: 1

## 2023-07-23 LAB — POC GLUCOSE
POC GLUCOSE: 169 mg/dL — ABNORMAL HIGH (ref 70–100)
POC GLUCOSE: 172 mg/dL — ABNORMAL HIGH (ref 70–100)
POC GLUCOSE: 159 mg/dL — ABNORMAL HIGH (ref 70–100)
POC GLUCOSE: 154 mg/dL — ABNORMAL HIGH (ref 70–100)
POC GLUCOSE: 128 mg/dL — ABNORMAL HIGH (ref 70–100)
POC GLUCOSE: 137 mg/dL — ABNORMAL HIGH (ref 70–100)
POC GLUCOSE: 139 mg/dL — ABNORMAL HIGH (ref 70–100)
POC GLUCOSE: 133 mg/dL — ABNORMAL HIGH (ref 70–100)
POC GLUCOSE: 233 mg/dL — ABNORMAL HIGH (ref 70–100)
POC GLUCOSE: 243 mg/dL — ABNORMAL HIGH (ref 70–100)
POC GLUCOSE: 190 mg/dL — ABNORMAL HIGH (ref 70–100)
POC GLUCOSE: 272 mg/dL — ABNORMAL HIGH (ref 70–100)
POC GLUCOSE: 345 mg/dL — ABNORMAL HIGH (ref 70–100)
POC GLUCOSE: 316 mg/dL — ABNORMAL HIGH (ref 70–100)
POC GLUCOSE: 283 mg/dL — ABNORMAL HIGH (ref 70–100)

## 2023-07-23 LAB — POC POTASSIUM: POTASSIUM, POC: 4.1 MMOL/L (ref 3.5–5.1)

## 2023-07-23 LAB — POC BLOOD GAS ARTERIAL
BASE DEF ART POC: 5 MMOL/L — ABNORMAL LOW (ref 40–50)
BICARB, ART POC: 20 MMOL/L — ABNORMAL LOW (ref 21–28)
PO2, ART POC: 108 mmHg — ABNORMAL HIGH (ref 80–100)

## 2023-07-23 LAB — ACTIVATED CLOTTING TIME HMS
ACTIVATED CLOTTING TIME HMS: 518 s
ACTIVATED CLOTTING TIME HMS: 503 s
ACTIVATED CLOTTING TIME HMS: 509 s
ACTIVATED CLOTTING TIME HMS: 519 s
ACTIVATED CLOTTING TIME HMS: 464 s
ACTIVATED CLOTTING TIME HMS: 518 s
ACTIVATED CLOTTING TIME HMS: 524 s
ACTIVATED CLOTTING TIME HMS: 107 s
ACTIVATED CLOTTING TIME HMS: 573 s

## 2023-07-23 LAB — BASELINE ACTIVATED CLOTTING TIME HMS: BASELINE ACTIVATED CLOTTING TIME HMS: 131 s

## 2023-07-23 LAB — SURGICAL PATHOLOGY

## 2023-07-23 LAB — POC SODIUM: SODIUM, POC: 141 MMOL/L (ref 137–147)

## 2023-07-23 MED ORDER — INSULIN GLARGINE 100 UNIT/ML (3 ML) SC INJ PEN
15 [IU] | Freq: Every day | SUBCUTANEOUS | 0 refills | Status: DC
Start: 2023-07-23 — End: 2023-07-24
  Administered 2023-07-23: 19:00:00 15 [IU] via SUBCUTANEOUS

## 2023-07-23 MED ORDER — INSULIN ASPART 100 UNIT/ML SC FLEXPEN
4 [IU] | Freq: Three times a day (TID) | SUBCUTANEOUS | 0 refills | Status: DC
Start: 2023-07-23 — End: 2023-07-24
  Administered 2023-07-23: 19:00:00 4 [IU] via SUBCUTANEOUS

## 2023-07-23 MED ORDER — TAMSULOSIN 0.4 MG PO CAP
.4 mg | Freq: Every day | ORAL | 0 refills | Status: DC
Start: 2023-07-23 — End: 2023-07-30
  Administered 2023-07-23 – 2023-07-29 (×7): 0.4 mg via ORAL

## 2023-07-23 MED ORDER — WARFARIN 3 MG PO TAB
3 mg | Freq: Every evening | ORAL | 0 refills | Status: DC
Start: 2023-07-23 — End: 2023-07-26
  Administered 2023-07-24 – 2023-07-26 (×3): 3 mg via ORAL

## 2023-07-23 MED ORDER — ALBUMIN, HUMAN 5 % IV SOLP
250 mL | Freq: Once | INTRAVENOUS | 0 refills | Status: CP
Start: 2023-07-23 — End: ?
  Administered 2023-07-23: 20:00:00 250 mL via INTRAVENOUS

## 2023-07-23 MED ADMIN — SODIUM CHLORIDE 0.9% IV SOLP [27838]: 500 mL | INTRAVENOUS_CENTRAL | @ 03:00:00 | Stop: 2023-07-23 | NDC 00338004903

## 2023-07-23 NOTE — Progress Notes
RT Adult Assessment Note    NAME:Darius Cherry             MRN: [id number]             DOB:08-27-1951          AGE: 72 y.o.  ADMISSION DATE: 07/22/2023             DAYS ADMITTED: LOS: 1 day    Additional Comments:  Impressions of the patient: The patient is resting in bed on 1L HFNC, NAD at this time. He denies any pulmonary history or history of OSA at this time  Intervention(s)/outcome(s): OPEP/PAP, IS   Patient education that was completed: Pt educated on OPEP/PAP and IS   Recommendations to the care team: Follow RT protocol     Vital Signs:  Pulse: 85  RR: (!) 30 PER MINUTE  SpO2: 96 %  O2 Device: High flow nasal cannula  Liter Flow: (S) 1 Lpm  O2%:      Breath Sounds:   Right Apex Breath Sounds: Clear (Implies normal)  Right Base Breath Sounds: Decreased;Coarse crackles  Left Apex Breath Sounds: Clear (Implies normal)  Left Base Breath Sounds: Decreased;Coarse crackles  All Breath Sounds: Coarse crackles  Respiratory Effort:   Respiratory WDL: Within Defined Limits  Comments:

## 2023-07-23 NOTE — Progress Notes
Adult Mechanical Ventilator Liberation    Name: Darius Cherry   MRN: [id number]     DOB: Aug 07, 1951      Age: 72 y.o.  Admission Date: 07/22/2023     LOS: 0 days     Date of Service: 07/22/2023        Adult Mechanical Ventilator Liberation: CTS       CTS: Weaning Assessment Criteria Met (RT Only): Yes  CTS: Ventilation Criteria Met (RT Only): No, RR < or equal to 6 or RR > or equal to 25 (Trial per Marissa, APRN)  NIF Ventilated: -23 cm H2O  $$ Vital Capacity (mL): 352 ml  $$ Extubation (RT only): Extubated to non-heated high flow cannula oxygen device$$ Extubation (RT only): Extubated to non-heated high flow cannula oxygen device  O2 Device: High flow nasal cannula  O2 Liter Flow: 2 Lpm  O2%: 40 %            respiratory status: Stable   See RT Assessment Note for additional interventions.    During trial it was noted by RT and RN that the patient had increased RR in which the NP was notified. She came to bedside and assessed the patient and tried to coach patient to slow his RR. VC was low due to shallow respirations despite coaching by both RT and NP. NP decided to proceed with intubation and the patient is now resting on 2L HFNC, NAD at this time.

## 2023-07-23 NOTE — Progress Notes
Block Charting    1. Time of block charting period: Start: 0520 Stop: 0540    2. Indication: Rapid resuscitation/emergent phase of care    3. Titration Summary (all infusions are titrated by parameter listed in order):    Drug Name Max Dose given during block charting event   Norepinephrine (mcg/kg/min) 0.12                                                 4. For starting and ending rates prior to and at the end of block charting event, see dose/rate verify documentation MAR    5. Summary of additional events/interventions (if applicable): during dangle and up to chair

## 2023-07-23 NOTE — Case Management (ED)
Case Management Admission Assessment    NAME:Darius Cherry                          MRN: [id number]             DOB:[birth year]          AGE: 72 y.o.  ADMISSION DATE: 07/22/2023             DAYS ADMITTED: LOS: 1 day      Today?s Date: 07/23/2023    Source of Information: This CM met with pt for assessment on this date.  Provided contact information and explanation of SW/NCM roles.  Reviewed [facility], Preparing for Discharge, and [facility] hand-outs.  Provided opportunity for questions and discussion. Pt/family encouraged to contact Case Management team with questions and concerns during hospitalization and until patient is able to transition back to the patient's primary care physician.    Per EMR pt is POD 1 from CABG x3 and aortic valve replacement.    PMH: HTN, severe aortic stenosis/insufficiency, acute MI in 2007, CAD, PCI with PTCA, atrial fibrillation, HLD, dyspnea on exertion, DM type 2, skin cancer with multiple excisions    Plan  Case Management Assessment, Assist PRN with SW/NCM Services  CM needs are not fully known, but may include ongoing assessment for potential DC needs.  NCM/SW team to continue to follow patient's plan of care via EMR and team huddle; will assist with discharge planning needs as indicated.     Assessment Notes  Patient and wife are agreeable to completing assessment at this time.   NCM provided contact information and an explanation of CM roles. Patient encouraged to contact case management with questions and concerns during hospitalization.   Patient lives with wife in single story home. Patient is typically independent in all ADLs.  Home support is assessed to be wife, Donold Sheffler, and adult children that live nearby.  Patient's previous HH, LTACH, SNF, IPR, DME, outpatient therapy experience includes: pt currently owns crutches and single point cane and has grab bars in shower. Endorses prior history of outpatient cardiac rehabilitation at [facility] in 2007 after acute MI. No prior history of HH, LTACH, SNF, IPR, outpatient OT/SLP, and home infusion services.  PCP confirmed to be Dr Wilford Grist. Last visit was 07/15/2023.  Primary insurance confirmed to be Medicare A&B. Secondary coverage confirmed to be Aetna. Pt does carry Part D coverage as well.  Pt obtains all prescription medication at [facility] in [place], [place]. Denies concerns with access or affordability.  Pt follows with Cardiologist Dr Danella Maiers at [facility].  Pt wife will provide transportation home upon DC.    Plan  Plan: Case Management Assessment, Assist PRN with SW/NCM Services    Patient Address/Phone  [address] [place] [address]  [phone] (home)     Emergency Contact  Extended Emergency Contact Information  Primary Emergency Contact: Chamblee,Susie  Address: [address]           [place], [place] [address] [place]  Home Phone: [phone]  Mobile Phone: [phone]  Relation: Spouse    Healthcare Directive  Healthcare Directive: No, patient does not have a healthcare directive  Would patient like to fill out a (a new) Healthcare Directive?: No, patient declined      Transportation  Does the Patient Need Case Management to Arrange Discharge Transport? (  ex: facility, ambulance, wheelchair/stretcher, Medicaid, cab, other): No  Will the Patient Use Family Transport?: Yes  Transportation Name, Phone and Availability #1: Wife Calder Janiga - [phone]    Expected Discharge Date  07/29/2023 2:00 PM    Living Situation Prior to Admission  Living Arrangements  Type of Residence: Home, independent  Living Arrangements: Spouse/significant other  Financial risk analyst / Tub: Tub/Shower Unit  How many levels in the residence?: 1  Can patient live on one level if needed?: N/A  Does residence have entry and/or inside stairs?: Yes (2 step entry)  Assistance needed prior to admit or anticipated on discharge: Yes (Anticipate minimal assistance needed upon DC)  Who provides assistance or could if needed?: Wife - Ezariah Einbinder - [phone]  Are they in good health?: Yes  Can support system provide 24/7 care if needed?: Yes  Level of Function   Prior level of function: Independent  Cognitive Abilities   Cognitive Abilities: Alert and Oriented, Engages in problem solving and planning, Participates in decision making, Recognizes impact of health condition on lifestyle, Understands nature of health condition    Financial Resources  Coverage  Primary Insurance: Medicare  Secondary Insurance: Medicare Supplement  Additional Coverage: None  Medication Coverage    Medication Coverage: Medicare Part D  Medicare Part D Plan: BCBS  Have you experienced a noticeable increase in your copay costs recently?: No  Are current medications affordable?: Yes  Do You Use a Co-Pay Card or a Medication Assistance Program to Help Manage Medication Costs?: No  Do You Manage Your Own Medications?: No  Who is Responsible for Ordering and Setting up Medications?: Wife - Chartered loss adjuster  Source of Income   Source Of Income: SSI  Financial Assistance Needed?  None.    Psychosocial Needs  Mental Health  Mental Health History: No  Substance Use History  Substance Use History Screen: No  Other  None.    Current/Previous Services  PCP  Wilford Grist, [phone], [phone]  Pharmacy    [facility]  [address]  [place] [address]  Phone: [phone] Fax: [phone]    [facility] [address] [place] [address] [place] [address] [place] [address]  [place] [address]  Phone: [phone] Fax: [phone]    Durable Medical Equipment   Durable Medical Equipment at home: Grab bars, Crutches, Single [employer]  Home Health  Receiving home health: No  Hemodialysis or Peritoneal Dialysis  Undergoing hemodialysis or peritoneal dialysis: No  Tube/Enteral Feeds  Receive tube/enteral feeds: No  Infusion  Receive infusions: No  Private Duty  Private duty help used: No  Home and Community Based Services  Home and community based services: No  Ryan White  Ryan White: N/A  Hospice  Hospice: No  Outpatient Therapy  PT: In the past  Name of rehab location/group: Outpatient cardiac rehab at [facility]  OT: No  SLP: No  Skilled Nursing Facility/Nursing Home  SNF: No  NH: No  Inpatient Rehab  IPR: No  Long-Term Acute Care Hospital  LTACH: No  Acute Hospital Stay  Acute Hospital Stay: In the past  Was patient's stay within the last 30 days?: No    Marguerita Beards BSN, RN, CCRN  Nurse Case Manager  Available on [place]  Phone: [phone]  Pager: [phone]

## 2023-07-23 NOTE — Progress Notes
END OF SHIFT SUMMARY HC3    Admission Date: 07/22/2023  Length of Stay: LOS: 1 day    Patient demeanor and cognition: A&Ox4, pleasant    Acute events, pain management, and nursing interventions: Extubated to 2L HFNC at 2112. Dangled with .08 pressor, up to chair with 0.12 pressor    Mobility: Up to Chair      If Other, please explain:     Pacing Requirements: No    Pressor Requirements: Yes- see eMAR  If Other, please explain:     CI > 2: Yes    Intake and Output:   Date 07/22/23 0701 - 07/23/23 0700 07/23/23 0701 - 07/24/23 0700   Shift 0701-1900 1901-0700 24 Hour Total 0701-1900 1901-0700 24 Hour Total   INTAKE   P.O.  480 480      I.V.(mL/kg/hr) 2258.8(2.1) 402.2 2660.9      Blood 149  149      Other  70 70      I.V.P.B./FLUSHES  100 100      Shift Total(mL/kg) 2407.8(26.3) 1052.2(10.7) 3459.9(35.3)      OUTPUT   Urine(mL/kg/hr) 980(0.9) 465 1445        Urine 655  655        Urine Output (mL) (Indwelling Urinary Catheter 16 FR Standard 2-way) 325 465 790      Drains 394 315 709        Drain Output (mL) (Chest Tube Medial Mediastinal 24 FR;X2 #1) 350 280 630        Drain Output (mL) (Chest Tube Left Pleural 19 FR #2) 24 35 59        Drain Output (mL) ([REMOVED] Temporary GI Tube Orogastric) 20  20      Shift Total(mL/kg) 1374(15) 780(8) 1610(96)      NET 1033.8 272.2 1305.9      Weight (kg) 91.6 98 98 98 98 98       Last Bowel Movement Date: 07/22/23 (pts)       Patient Education  Patient education provided: Early and frequent mobility post procedure  Other patient education provided:  Learners: Patient  Method/materials used: Verbal teaching  Response to learning: Some Evidence of Learning, Needs Reinforcement      Patient Goal(s):  Patient will Maintain stable fluid volume with clear breath sounds and vital signs within normal limit by the end of shift         Patient will  Report progressive increase in activity tolerance by the end of shift   Other:

## 2023-07-23 NOTE — Consults
Endocrinology Consultation    Today's Date:  07/23/2023  Admission Date: 07/22/2023    Reason for this consultation:  DM management  Type of consult: Co-Management w/Signed Orders    Assessment:     Darius Cherry is a 72 yr old male with history of type 2 DM and CAD, hypertension, paroxysmal atrial fibrillation who underwent CABG x 3 and aortic valve replacement on 07/22/2023.     Type 2 Diabetes Mellitus, uncontrolled  Steroid-induced hyperglycemia    Diabetes History:  Dx: > 10 years ago  Outpatient Regimen:  Rybelsus 14mg  tablet daily, metformin XR 500mg  bid, Jardiance 25mg  daily  Outpatient provider:  PCP, Dr. Wilford Grist  A1c:  7.4% (06/10/2023)  prior A1c 9.5% in 04/2022    Coronary artery disease  Hypertension  Dyslipidemia    Glucose, POC   Date/Time Value Ref Range Status   07/23/2023 1121 243 (H) 70 - 100 MG/DL Final   16/60/6301 6010 233 (H) 70 - 100 MG/DL Final   93/23/5573 2202 116 (H) 70 - 100 MG/DL Final   54/27/0623 7628 124 (H) 70 - 100 MG/DL Final   31/51/7616 0737 133 (H) 70 - 100 MG/DL Final   10/62/6948 5462 139 (H) 70 - 100 MG/DL Final   70/35/0093 8182 137 (H) 70 - 100 MG/DL Final   99/37/1696 7893 128 (H) 70 - 100 MG/DL Final     Steroids: none  Diet: carb consistent    Recommendations:     BG trends reviewed.  Mostly with euglycemia and BG at goal on insulin drip.   Higher BG noted this morning after breakfast.     He is now just one low dose norepinephrine, tolerating diet.  Will plan to transition off insulin drip to subq regimn    Lantus 15 units now - continue daily  Begin Novolog 4 units tid with meals, hold if NPO or not eating  Continue insulin drip through day today  Hold Novolog correction factor - will begin once drip has been discontinued  Hold oral medications    Discharge planning:  - final regimen TBD  - he may or may not need new insulin at discharge; hopefully he can resume home medications  - would benefit from discussion on transitioning from Rybelsus to injectable GLP-1 - will discuss with him  - plan to have him f/u locally with PCP    Thank you for this consult, we will follow.      Darius Cherry  Assistant Professor  Endocrinology, Diabetes & Metabolism  Pager: [phone]    History of Present Illness    Darius Cherry is a 72 yr old male with history of type 2 DM and CAD, hypertension, paroxysmal atrial fibrillation who underwent CABG x 3 and aortic valve replacement on 07/22/2023.     He reports being diagnosed with DM about 10 years ago. DM is managed by his PCP  Home diabetes regimen includes Rybelsus 14mg  tablet daily, metformin XR 500mg  bid, Jardiance 25mg  daily.  Most recent A1c 7.8% in September 2024    He is admitted for aortic valve replacement and CABG x 3 on 11/4.  He tolerated procedure well.  He is seen in CICU, insulin drip and norepi drip running.  He was able to tolerate breakfast.     Estimated Creatinine Clearance: 106 mL/min (based on SCr of 0.73 mg/dL).    Past Medical History     Past Medical History:   Diagnosis Date    Aortic valve stenosis  Atrial fibrillation (HCC)     CAD (coronary artery disease) 04/26/2009    DM (diabetes mellitus) (HCC)     Fracture 2016    Left ankle    History of coronary artery stent placement 2007/ 2013    x4    Hyperlipemia 04/26/2009    Hypertension 04/26/2009    Murmur, cardiac     Myocardial infarction [facility]) 2007    x2 stents    Skin cancer     lips, head       Past Surgical History     Surgical History:   Procedure Laterality Date    ARTHROPLASTY REPLACEMENT TOTAL ANKLE (PROPHECY CASE [phone]) Left 11/05/2016    Performed by Ree Shay, MD at [facility] OR    LENGTHENING LEFT  ACHILLES TENDON Left 11/05/2016    Performed by Ree Shay, MD at [facility] OR    ARTHROPLASTY REVISION TOTAL ANKLE TO IN-BONE (PROPHECY [phone]) Left 03/28/2018    Performed by Ree Shay, MD at [facility] OR    REMOVAL HARDWARE - DEEP - LOWER EXTREMITY  03/28/2018    Performed by Ree Shay, MD at [facility] OR    ANGIOGRAPHY CORONARY ARTERY WITH LEFT HEART CATHETERIZATION N/A 06/10/2023    Performed by Laney Pastor, MD at [facility] CATH LAB    PERCUTANEOUS CORONARY STENT PLACEMENT WITH ANGIOPLASTY N/A 06/10/2023    Performed by Laney Pastor, MD at [facility] CATH LAB    CORONARY ANGIOPLASTY      HAND SURGERY Left     HX CORONARY STENT PLACEMENT      HX HEART CATHETERIZATION  2007, 05/2012    x4 stents total    SKIN CANCER EXCISION      left ear flap       Social History     Social History     Tobacco Use    Smoking status: Former     Current packs/day: 0.00     Average packs/day: 2.0 packs/day for 5.0 years (10.0 ttl pk-yrs)     Types: Cigarettes     Start date: 02/15/2001     Quit date: 02/15/2006     Years since quitting: 17.4    Smokeless tobacco: Never   Substance Use Topics    Alcohol use: Yes     Alcohol/week: 2.0 standard drinks of alcohol     Types: 2 Cans of beer per week     Comment: social drinking       Family History     Family History   Problem Relation Name Age of Onset    Diabetes Mother Darius Cherry     Stroke Mother Darius Cherry     Coronary Artery Disease Father Darius Cherry     Diabetes Father Darius Cherry        Allergies     Allergies   Allergen Reactions    Celebrex [Celecoxib] RASH and ITCHING       Review of Systems   A comprehensive 14-point review of systems was negative with exception of fatigue, incisional chest discomfort      Medications   Scheduled Meds:acetaminophen (TYLENOL EXTRA STRENGTH) tablet 1,000 mg, 1,000 mg, Oral, Q6H while awake  aspirin chewable tablet 81 mg, 81 mg, Oral, QDAY  atorvastatin (LIPITOR) tablet 80 mg, 80 mg, Oral, QHS  ceFAZolin (ANCEF) injection 2 g, 2 g, Intravenous, Q8H*  lidocaine (LIDODERM) 5 % topical patch 1 patch, 1 patch, Topical, QDAY  polyethylene glycol 3350 (MIRALAX) packet 17 g, 1 packet, Oral, BID  sennosides-docusate sodium (SENOKOT-S) tablet  2 tablet, 2 tablet, Oral, BID  tamsulosin (FLOMAX) capsule 0.4 mg, 0.4 mg, Oral, QDAY after breakfast    Continuous Infusions:   insulin regular 100 units/NS 100 mL IV drip (premade) 2.5 Units/hr (07/23/23 1031) norepinephrine (LEVOPHED) 4 mg in dextrose 5% (D5W) 250 mL IV drip (std conc) 0.02 mcg/kg/min (07/23/23 0830)     PRN and Respiratory Meds:[START ON 07/27/2023] acetaminophen Q6H PRN **OR** [START ON 07/27/2023] acetaminophen Q6H PRN, alum-mag hydroxide-simeth Q4H PRN, bisacodyL QDAY PRN, dextrose 50% (D50) IV PRN, fentaNYL citrate PF Q1H PRN, hydrALAZINE Q6H PRN, lidocaine PF PRN, magnesium sulfate PRN **OR** magnesium oxide PRN, milk of magnesium QDAY PRN, ondansetron Q6H PRN **OR** ondansetron (ZOFRAN) IV Q6H PRN, oxyCODONE Q4H PRN, potassium chloride PRN **OR** potassium chloride PRN **OR** potassium chloride in water PRN      Physical Examination                          Vital Signs: Last                  Vital Signs: 24 Hour Range   Temp: 37.1 ?C (98.8 ?F) (11/05 0900)  Pulse: 76 (11/05 0900)  Respirations: 24 PER MINUTE (11/05 0900)  SpO2: 95 % (11/05 0900)  O2%: 40 % (11/04 2115)  O2 Device: High flow nasal cannula (11/05 0857)  O2 Liter Flow: 1 Lpm (11/05 0857) ABP: (89-140)/(46-79)   Temp:  [36 ?C (96.8 ?F)-37.5 ?C (99.5 ?F)]   Pulse:  [72-90]   Respirations:  [14 PER MINUTE-44 PER MINUTE]   SpO2:  [94 %-99 %]   O2%:  [40 %-100 %]   O2 Device: High flow nasal cannula  O2 Liter Flow: 1 Lpm     General appearance: alert, oriented, NAD  HENT: mucus membranes moist, no oral lesions/thrush  Eyes: PERRL, EOM grossly intact, Conj nl, no evidence of orbitopathy  Neck: supple, no lymphadenopathy, no thyromegaly  Lungs: Clear to auscultation bilaterally, no respiratory distress.  Heart: Regular rhythm, no murmur noted.  Abdomen: soft, non-tender, non-distended  Ext:  No cyanosis, no edema.  Skin: no rashes/lesions noted.  Lymph: no cervical, axillary or inguinal adenopathy      Lab Review     Point of Care Testing  (Last 24 hours)  Glucose: (!) 130 (07/23/23 0252)  POC Glucose (Download): (!) 243 (07/23/23 1121)    Recent Labs     07/22/23  1525 07/23/23  0252   NA 139 139   K 4.9 4.0   CL 112* 109   CO2 21 23 GAP 6 7   BUN 25 23   CR 0.87 0.73   GLU 212* 130*   CA 7.5* 7.3*   MG 2.6 2.5       Recent Labs     07/22/23  1525 07/23/23  0252   WBC 10.9 10.6   HGB 11.0* 10.7*   HCT 32.3* 32.0*   PLTCT 128* 155   PT 12.3  --    INR 1.1  --    PTT 27.9  --       Estimated Creatinine Clearance: 106 mL/min (based on SCr of 0.73 mg/dL).  Vitals:    07/22/23 0613 07/23/23 0455   Weight: 91.6 kg (201 lb 15.1 oz) 98 kg (216 lb 0.8 oz)        Thyroid Studies    Lab Results   Component Value Date/Time    TSH 3.17 12/11/2021 12:00 AM  No results found for: Edsel Petrin, [facility]       Pertinent radiology images reviewed.      Latrelle Dodrill, Cherry   07/23/2023  Endocrine

## 2023-07-23 NOTE — Progress Notes
POD1 from cabg/avr. Extubated, doing well, had some pain and mild lightheadedness ambulating to chair. Sinus 80s this AM, CVP 4, pap 30/10s, on norepi at 0.02. CT with 659 cc out, serosanguinous. CXR stable w/o large effusion.     - keep chest tubes  - hold diuresis, appears volume down  - wean norepi, if able to ambulate off pressor can be tele this afternoon  - will start beta blocker in coming days  - pull PAC  - endo consult given hba1c 7.8 preoperatively

## 2023-07-23 NOTE — Progress Notes
Cardiothoracic Surgery Critical Care Progress Note     [name]  GMWNU'U Date:  07/23/2023  Admission Date: 07/22/2023  LOS: 1 day    Procedure date: 11/4  Procedure(s):  Coronary artery bypass grafting x3, left internal mammary artery take down, endoscopic vein harvest, Aortic Valve Replacement- tissue, left atrial appendage ligation.  POD#: 1    Active Problems:    Hyperlipemia    Coronary artery disease involving native coronary artery of native heart without angina pectoris    Overweight (BMI 25.0-29.9)    Essential hypertension    Nonrheumatic aortic valve stenosis    Type 2 diabetes mellitus without complication, without long-term current use of insulin (HCC)    Paroxysmal atrial fibrillation (HCC)    S/P CABG x 3    S/P AVR (aortic valve replacement)    Acute blood loss anemia    Platelet dysfunction (HCC)    Thrombocytopenia (HCC)    Vasogenic shock (HCC)    Stress hyperglycemia    SIRS without infection or organ dysfunction (HCC)        Assessment/Plan:      Neuro - Pain controlled. Continue PRN oxycodone with scheduled Tylenol & Lidoderm patches. Hx of daily ETOH use (6 drinks daily, last on 10/31).  AWAS monitoring (no PRN medications for now).    CV - SR, rates 80s-90s, BP: 89-125/50-60s, MAP 64-80, on norepi 0.81mcg/kg/min, titrate for MAP goal >65.  CI 2 per FICK, CVP 2-7, PA 25-32/12-17.  Dc PAC.  Cont ASA, statin. Sotalol PTA for afib, will discontinue amio prophylaxis. Hold BB due to hypotension.  Post-procedure TEE LVEF 55-60%.  Resp - SpO2 98% on 2L. Wean O2. CXR interpretation:  left effusion, atelectasis, lines in place ; reviewed radiology report. Cont IS, aggressive pulm toilet. Chest tubes plan: keep to suction today.     Renal - Baseline sCr. 0.9->0.73 today.  UOP: 1.4L, overal +1.8L, hold diuretic this am for low filling pressures.  Diurese as needed. Resume PTA flomax.  Discontinue foley catheter when hemodynamically stable and appropriate.   GI - ADAT, Last BM: preop, continue post op bowel regimen.   ID - Afebrile. WBC: 10.6  Heme - Hgb 10.7,  Hold DVT prophylaxis until POD 3, continue mechanical prophylaxis. Discuss anticoagulation plan for tissue valve with [facility].    FEN - hypokalemia and hypomagnesemia replace Mg and K to minimize the risk of cardiac arrhythmias. A1c 7.4%, Insulin gtt per protocol.  Consult endo today.   Activity - Pt up to chair today, should ambulate in halls with nursing and cardiac rehab TID.    Disposition: Plan as above. Continue ICU care while pt critically ill with Invasive Hemodynamic Management, Management of Cardiac Support Infusions, Management of Vasogenic Shock, Comprehensive Pain Management, and Complex Fluid & Electrolyte Management.    I have seen, personally fully evaluated, and discussed patient with critical care attending Dr. Judy Pimple and the cardiothoracic surgeon. The patient is critically ill and at risk for life threatening deterioration. I spent 55 minutes (excluding time spent performing or supervising any procedures) providing and personally directing critical care services.     Prophylaxis Review:  Lines:  Yes; Arterial Line; Indication:  Frequent blood draws and Continuous BP monitoring; Location:  Radial  Central Line; Indication:  Med not deliverable peripherally, Incompatability of meds, and Hemodynamic monitoring; Type:  Internal jugular  Antibiotic Usage:  No  VTE: Mechanical prophylaxis: Foot pump  Urinary Catheter: Yes; Retain foley due to:  Need for accurate Intake  and Output  Temporary Pacing Wires Present?: Yes Date Removed: NA    Darius Guess, APRN-NP   [facility] Intensive Care  Pager [phone]  07/23/2023    Subjective:       HPI:   Darius Cherry is a 72 y.o. male whose current medical conditions include CAD s/p PCI to circumflex in 2007 and then PCI to circumflex and LAD in 2013, paroxysmal afib on apixaban, HTN, HLD, DM2, severe aortic stenosis (follows with Dr. Barry Dienes). Referred to [facility] valve clinic. Found to have CAD. He is now s/p CABG/AVR (t) with Dr. Sindy Messing.      11/5: wean norepi, dc PAC, possible tele     Review of Systems   Constitutional:  Negative for chills and fever.   Eyes:  Negative for blurred vision and double vision.   Respiratory:  Negative for cough and shortness of breath.    Cardiovascular:  Negative for chest pain, palpitations and leg swelling.        Positive for left sided chest pain related to chest tube    Gastrointestinal:  Negative for abdominal pain, nausea and vomiting.   Neurological:  Negative for tremors, sensory change, speech change and focal weakness.   Psychiatric/Behavioral:  Negative for hallucinations. The patient is not nervous/anxious.           Objective:        Medications:  Scheduled Meds:acetaminophen (TYLENOL EXTRA STRENGTH) tablet 1,000 mg, 1,000 mg, Oral, Q6H while awake  aspirin chewable tablet 81 mg, 81 mg, Oral, QDAY  atorvastatin (LIPITOR) tablet 80 mg, 80 mg, Oral, QHS  ceFAZolin (ANCEF) injection 2 g, 2 g, Intravenous, Q8H*  lidocaine (LIDODERM) 5 % topical patch 1 patch, 1 patch, Topical, QDAY  polyethylene glycol 3350 (MIRALAX) packet 17 g, 1 packet, Oral, BID  sennosides-docusate sodium (SENOKOT-S) tablet 2 tablet, 2 tablet, Oral, BID  tamsulosin (FLOMAX) capsule 0.4 mg, 0.4 mg, Oral, QDAY after breakfast    Continuous Infusions:   insulin regular 100 units/NS 100 mL IV drip (premade) 1 Units/hr (07/23/23 0710)    norepinephrine (LEVOPHED) 4 mg in dextrose 5% (D5W) 250 mL IV drip (std conc) 0.02 mcg/kg/min (07/23/23 0324)     PRN and Respiratory Meds:[START ON 07/27/2023] acetaminophen Q6H PRN **OR** [START ON 07/27/2023] acetaminophen Q6H PRN, alum-mag hydroxide-simeth Q4H PRN, bisacodyL QDAY PRN, dextrose 50% (D50) IV PRN, fentaNYL citrate PF Q1H PRN, hydrALAZINE Q6H PRN, lidocaine PF PRN, magnesium sulfate PRN **OR** magnesium oxide PRN, milk of magnesium QDAY PRN, ondansetron Q6H PRN **OR** ondansetron (ZOFRAN) IV Q6H PRN, oxyCODONE Q4H PRN, potassium chloride PRN **OR** potassium chloride PRN **OR** potassium chloride in water PRN                       Vital Signs: Last Filed                  Vital Signs: 24 Hour Range   Temp: 36.9 ?C (98.4 ?F) (11/05 0600)  Pulse: 78 (11/05 0600)  Respirations: 23 PER MINUTE (11/05 0600)  SpO2: 97 % (11/05 0600)  O2%: 40 % (11/04 2115)  O2 Device: High flow nasal cannula (11/05 0600)  O2 Liter Flow: 2 Lpm (11/05 0600) ABP: (89-140)/(46-79)   Temp:  [36 ?C (96.8 ?F)-37.5 ?C (99.5 ?F)]   Pulse:  [72-90]   Respirations:  [14 PER MINUTE-44 PER MINUTE]   SpO2:  [94 %-99 %]   O2%:  [40 %-100 %]   O2 Device: High flow nasal  cannula  O2 Liter Flow: 2 Lpm   Intensity Pain Scale (Self Report): 3 (07/23/23 0005) Vitals:    07/22/23 0613 07/23/23 0455   Weight: 91.6 kg (201 lb 15.1 oz) 98 kg (216 lb 0.8 oz)           Intake/Output Summary:  (Last 24 hours)    Intake/Output Summary (Last 24 hours) at 07/23/2023 0746  Last data filed at 07/23/2023 0700  Gross per 24 hour   Intake 3983.72 ml   Output 2154 ml   Net 1829.72 ml         Physical Exam:         Neuro: A&O x4  Cardiovascular: RRR   Respiratory: LS CTA bilaterally - diminished bases  GI: soft, NT, hypoactive BS  Extremities: No Edema  Incisions: Sternal incision dressing, dry and intact. No crepitus or sternal instability.      Chest Tubes OUTPUT/24 HOURS Suction AIR LEAK PRESENT   L 19 fr 60 mL -20 No   24x2 fr 630 mL -20 No     Pertinent Meds:               Taking           Reason for Not Taking  1. Aspirin Yes  N/a   2. B-Blocker No No: Hypotension   3. Statin Yes  N/a   4. ACE/ARB No No: No Indication (LVEF >40%)       Artificial airway:  None                                                                                         Vent weaning trial:  Not applicable    LABS:  Recent Labs     07/22/23  1525 07/23/23  0252   NA 139 139   K 4.9 4.0   CL 112* 109   CO2 21 23   GAP 6 7   BUN 25 23   CR 0.87 0.73   GLU 212* 130*   CA 7.5* 7.3*   MG 2.6 2.5       Recent Labs     07/22/23  1525 07/23/23  0252 WBC 10.9 10.6   HGB 11.0* 10.7*   HCT 32.3* 32.0*   PLTCT 128* 155   PT 12.3  --    INR 1.1  --    PTT 27.9  --       Estimated Creatinine Clearance: 106 mL/min (based on SCr of 0.73 mg/dL).  Vitals:    07/22/23 0613 07/23/23 0455   Weight: 91.6 kg (201 lb 15.1 oz) 98 kg (216 lb 0.8 oz)      Recent Labs     07/22/23  2011 07/22/23  2147   PHART 7.33* 7.37   PO2ART 113* 111*           Radiology and Other Diagnostic Procedures Review:    Reviewed

## 2023-07-24 ENCOUNTER — Encounter: Admit: 2023-07-24 | Discharge: 2023-07-24 | Payer: MEDICARE

## 2023-07-24 ENCOUNTER — Inpatient Hospital Stay: Admit: 2023-07-24 | Discharge: 2023-07-24 | Payer: MEDICARE

## 2023-07-24 LAB — POC GLUCOSE
POC GLUCOSE: 115 mg/dL — ABNORMAL HIGH (ref 70–100)
POC GLUCOSE: 121 mg/dL — ABNORMAL HIGH (ref 70–100)
POC GLUCOSE: 130 mg/dL — ABNORMAL HIGH (ref 70–100)
POC GLUCOSE: 131 mg/dL — ABNORMAL HIGH (ref 70–100)
POC GLUCOSE: 135 mg/dL — ABNORMAL HIGH (ref 70–100)
POC GLUCOSE: 137 mg/dL — ABNORMAL HIGH (ref 70–100)
POC GLUCOSE: 143 mg/dL — ABNORMAL HIGH (ref 70–100)
POC GLUCOSE: 149 mg/dL — ABNORMAL HIGH (ref 70–100)
POC GLUCOSE: 165 mg/dL — ABNORMAL HIGH (ref 70–100)
POC GLUCOSE: 166 mg/dL — ABNORMAL HIGH (ref 70–100)
POC GLUCOSE: 187 mg/dL — ABNORMAL HIGH (ref 70–100)
POC GLUCOSE: 192 mg/dL — ABNORMAL HIGH (ref 70–100)
POC GLUCOSE: 211 mg/dL — ABNORMAL HIGH (ref 70–100)
POC GLUCOSE: 246 mg/dL — ABNORMAL HIGH (ref 70–100)
POC GLUCOSE: 252 mg/dL — ABNORMAL HIGH (ref 70–100)

## 2023-07-24 LAB — BASIC METABOLIC PANEL
CALCIUM: 8.3 mg/dL — ABNORMAL LOW (ref 8.5–10.6)
CREATININE: 0.6 mg/dL — ABNORMAL LOW (ref 0.4–1.24)
EGFR: >60 mL/min — ABNORMAL HIGH (ref >60)
GLUCOSE,PANEL: 129 mg/dL — ABNORMAL HIGH (ref 70–100)

## 2023-07-24 LAB — ECG 12-LEAD
P AXIS: 76 degrees
P-R INTERVAL: 198 ms
Q-T INTERVAL: 386 ms
Q-T INTERVAL: 356 ms
QRS DURATION: 94 ms
QRS DURATION: 96 ms
QTC CALCULATION (BAZETT): 449 ms
R AXIS: 32 degrees
R AXIS: 18 degrees
T AXIS: -28 degrees
T AXIS: -3 degrees
T AXIS: -22 degrees
VENTRICULAR RATE: 85 {beats}/min
VENTRICULAR RATE: 96 {beats}/min

## 2023-07-24 LAB — BLOOD GASES, ARTERIAL
BASE EXCESS-ART: 1 MMOL/L
BICARB, ART(CAL): 25 MMOL/L (ref 21–28)
O2 SAT,ART(CALC.): 98 % (ref 95–99)
PCO2-ART: 38 mmHg (ref 35–45)
PH-ART: 7.4 (ref 7.35–7.45)
PO2-ART: 101 mmHg — ABNORMAL HIGH (ref 80–100)

## 2023-07-24 LAB — CBC: MPV: 9.9 FL (ref 7–11)

## 2023-07-24 MED ORDER — VASOPRESSIN 0.2 UNIT/ML IV SOLN
.6-2.4 [IU]/h | INTRAVENOUS | 0 refills | Status: DC
Start: 2023-07-24 — End: 2023-07-25
  Administered 2023-07-24: 17:00:00 0.6 [IU]/h via INTRAVENOUS
  Administered 2023-07-25: 01:00:00 2.4 [IU]/h via INTRAVENOUS

## 2023-07-24 MED ORDER — INSULIN GLARGINE 100 UNIT/ML (3 ML) SC INJ PEN
5 [IU] | Freq: Once | SUBCUTANEOUS | 0 refills | Status: CP
Start: 2023-07-24 — End: ?

## 2023-07-24 MED ORDER — SOTALOL 80 MG PO TAB
80 mg | Freq: Two times a day (BID) | ORAL | 0 refills | Status: DC
Start: 2023-07-24 — End: 2023-07-26
  Administered 2023-07-24 – 2023-07-26 (×5): 80 mg via ORAL

## 2023-07-24 MED ORDER — INSULIN GLARGINE 100 UNIT/ML (3 ML) SC INJ PEN
20 [IU] | Freq: Every day | SUBCUTANEOUS | 0 refills | Status: DC
Start: 2023-07-24 — End: 2023-07-26

## 2023-07-24 MED ORDER — DIAZEPAM 5 MG PO TAB
5 mg | Freq: Once | ORAL | 0 refills | Status: CP
Start: 2023-07-24 — End: ?
  Administered 2023-07-24: 13:00:00 5 mg via ORAL

## 2023-07-24 MED ORDER — PROPOFOL INJ 10 MG/ML IV VIAL
80 mg | Freq: Once | INTRAVENOUS | 0 refills | Status: CP
Start: 2023-07-24 — End: ?

## 2023-07-24 MED ORDER — METHYLNALTREXONE 12 MG/0.6 ML SC SOLN
12 mg | Freq: Once | SUBCUTANEOUS | 0 refills | Status: CP
Start: 2023-07-24 — End: ?
  Administered 2023-07-24: 13:00:00 12 mg via SUBCUTANEOUS

## 2023-07-24 MED ORDER — ALBUMIN, HUMAN 5 % IV SOLP
250 mL | Freq: Once | INTRAVENOUS | 0 refills | Status: DC
Start: 2023-07-24 — End: 2023-07-25

## 2023-07-24 MED ORDER — ALBUMIN, HUMAN 5 % IV SOLP
250 mL | Freq: Once | INTRAVENOUS | 0 refills | Status: CP
Start: 2023-07-24 — End: ?
  Administered 2023-07-24: 15:00:00 250 mL via INTRAVENOUS

## 2023-07-24 MED ORDER — INSULIN ASPART 100 UNIT/ML SC FLEXPEN
0-24 [IU] | Freq: Every day | SUBCUTANEOUS | 0 refills | Status: DC
Start: 2023-07-24 — End: 2023-07-26
  Administered 2023-07-24: 17:00:00 8 [IU] via SUBCUTANEOUS

## 2023-07-24 MED ORDER — METHOCARBAMOL 500 MG PO TAB
750 mg | ORAL | 0 refills | Status: DC
Start: 2023-07-24 — End: 2023-07-30
  Administered 2023-07-24 – 2023-07-29 (×16): 750 mg via ORAL

## 2023-07-24 MED ORDER — INSULIN ASPART 100 UNIT/ML SC FLEXPEN
6 [IU] | Freq: Three times a day (TID) | SUBCUTANEOUS | 0 refills | Status: DC
Start: 2023-07-24 — End: 2023-07-30

## 2023-07-24 MED ORDER — KETOROLAC 30 MG/ML (1 ML) IJ SOLN
15 mg | Freq: Once | INTRAVENOUS | 0 refills | Status: CP
Start: 2023-07-24 — End: ?
  Administered 2023-07-24: 14:00:00 15 mg via INTRAVENOUS

## 2023-07-24 MED ORDER — ALBUMIN, HUMAN 5 % IV SOLP
250 mL | Freq: Once | INTRAVENOUS | 0 refills | Status: CP
Start: 2023-07-24 — End: ?

## 2023-07-24 MED ADMIN — PROPOFOL 10 MG/ML IV EMUL [11150]: 80 mg | INTRAVENOUS | @ 23:00:00 | Stop: 2023-07-24 | NDC 63323026922

## 2023-07-24 MED ADMIN — ALBUMIN, HUMAN 5 % IV SOLP [8982]: 250 mL | INTRAVENOUS | @ 16:00:00 | Stop: 2023-07-24 | NDC 68516521403

## 2023-07-24 NOTE — Progress Notes
Cardiothoracic Surgery Critical Care Progress Note     Darius Cherry  [id number] Date:  07/24/2023  Admission Date: 07/22/2023  LOS: 2 days    Procedure date: 11/4  Procedure(s):  Coronary artery bypass grafting x3, left internal mammary artery take down, endoscopic vein harvest, Aortic Valve Replacement- tissue, left atrial appendage ligation.  POD#: 2    Active Problems:    Hyperlipemia    Coronary artery disease involving native coronary artery of native heart without angina pectoris    Overweight (BMI 25.0-29.9)    Essential hypertension    Nonrheumatic aortic valve stenosis    Type 2 diabetes mellitus without complication, without long-term current use of insulin (HCC)    Paroxysmal atrial fibrillation (HCC)    S/P CABG x 3    S/P AVR (aortic valve replacement)    Acute blood loss anemia    Platelet dysfunction (HCC)    Thrombocytopenia (HCC)    Vasogenic shock (HCC)    Stress hyperglycemia    SIRS without infection or organ dysfunction (HCC)    Post-operative pain    Anticoagulated on warfarin    Addendum: BP slowly decreasing since conversion to AFib this AM, now requiring pressor ~ 0900, given Albumin with some improvement in BP but still requiring low dose NE. HR 105-120s. Just had breakfast, make NPO for possible DCCV if needed later today.     Assessment/Plan:      Neuro - Poor pain control. Continue PRN fentanyl, oxycodone with scheduled Tylenol & Lidoderm patches. Trial valium x1 this AM. Give Toradol x1 per CTS.   Hx of daily ETOH use (6 drinks daily, last on 10/31).  AWAS monitoring (no PRN medications for now).      CV - SR--> AFib, rate 90s this AM ~ 0600. Resume PTA Sotalol, may need dose increase. BP 110-120s/50s (70s) overnight off NE. Maintain MAP > 65.  Cont ASA, statin. No Amio ppx. Hold BB due to hypotension.  Post-procedure TEE LVEF 55-60%.    Resp - SpO2 94% on 1L. Wean O2. CXR interpretation: bilateral pleural effusions, poor depth, dilated bowel loops; reviewed radiology report. Cont IS, aggressive pulm toilet. Chest tubes plan: maintain to suction.       Renal - Baseline sCr. 0.9. UOP: 1L, net +62mL/24hrs. Has not received diuresis post op d/t low filling pressures- continue holding. Cont PTA flomax.  Discontinue foley catheter when hemodynamically stable and appropriate.     GI - ADAT, Last BM: preop, continue post op bowel regimen. Relistor 11/6.    ID - Afebrile. WBC: 11.8. Completed peri op abx.     Heme - Hgb 9.8,  Hold DVT prophylaxis until POD 3, continue mechanical prophylaxis. Holding PTA Eliquis, started on Warfarin 11/5, continue QHS. Daily INR.     FEN - replace Mg and K to minimize the risk of cardiac arrhythmias. A1c 7.4%, Endocrine following- appreciate assistance. Currently on insulin gtt, TID aspart & Lantus.    Activity - Pt up to chair today, should ambulate in halls with nursing and cardiac rehab TID.      Disposition: Plan as above. Continue ICU care while pt critically ill with Invasive Hemodynamic Management, Management of Vasogenic Shock, Comprehensive Pain Management, and Complex Fluid & Electrolyte Management.    I have seen, personally fully evaluated, and discussed patient with critical care attending Dr. Judy Pimple and the cardiothoracic surgeon. The patient is critically ill and at risk for life threatening deterioration. I spent 58 minutes (excluding time spent  performing or supervising any procedures) providing and personally directing critical care services.     Prophylaxis Review:  Lines:  Yes; Arterial Line; Indication:  Frequent blood draws and Continuous BP monitoring; Location:  Radial  Central Line; Indication:  Med not deliverable peripherally, Incompatability of meds, and Hemodynamic monitoring; Type:  Internal jugular  Antibiotic Usage:  No  VTE: Pharmacological prophylaxis: Warfarin and Mechanical prophylaxis: Foot pump  Urinary Catheter: Yes; Retain foley due to:  Need for accurate Intake and Output  Temporary Pacing Wires Present?: Yes Date Removed: NA    Normajean Glasgow, APRN-NP   [facility] Intensive Care  Pager [phone]  07/24/2023    Subjective:       HPI:   Darius Cherry is a 72 y.o. male whose current medical conditions include CAD s/p PCI to circumflex in 2007 and then PCI to circumflex and LAD in 2013, paroxysmal afib on apixaban, HTN, HLD, DM2, severe aortic stenosis (follows with Dr. Barry Dienes). Referred to [facility] valve clinic. Found to have CAD. He is now s/p CABG/AVR (t) with Dr. Sindy Messing.      11/5: wean norepi, dc PAC, possible tele   11/6: Afib, low BP, +volume and pressor, resume sotalol, relistor, pain control    Review of Systems   Constitutional:  Positive for diaphoresis. Negative for chills and fever.   Eyes:  Negative for blurred vision and double vision.   Respiratory:  Negative for cough and shortness of breath.    Cardiovascular:  Positive for chest pain. Negative for palpitations and leg swelling.        Positive for left sided chest pain related to chest tube    Gastrointestinal:  Negative for abdominal pain, nausea and vomiting.   Neurological:  Negative for dizziness, tremors, sensory change, speech change, focal weakness and headaches.   Psychiatric/Behavioral:  Negative for hallucinations. The patient is not nervous/anxious.           Objective:        Medications:  Scheduled Meds:acetaminophen (TYLENOL EXTRA STRENGTH) tablet 1,000 mg, 1,000 mg, Oral, Q6H while awake  albumin  5 % injection 250 mL, 250 mL, Intravenous, ONCE  ALBUMIN, HUMAN 5 % IV SOLP (Cabinet Override), , , NOW  ALBUMIN, HUMAN 5 % IV SOLP (Cabinet Override), , , NOW  aspirin chewable tablet 81 mg, 81 mg, Oral, QDAY  atorvastatin (LIPITOR) tablet 80 mg, 80 mg, Oral, QHS  insulin aspart (U-100) (NOVOLOG FLEXPEN U-100 INSULIN) injection PEN 0-24 Units, 0-24 Units, Subcutaneous, 5 X Daily  insulin aspart (U-100) (NOVOLOG FLEXPEN U-100 INSULIN) injection PEN 6 Units, 6 Units, Subcutaneous, TID w/ meals  [START ON 07/25/2023] insulin glargine (LANTUS SOLOSTAR U-100 INSULIN) injection PEN 20 Units, 20 Units, Subcutaneous, QDAY  lidocaine (LIDODERM) 5 % topical patch 1 patch, 1 patch, Topical, QDAY  polyethylene glycol 3350 (MIRALAX) packet 17 g, 1 packet, Oral, BID  sennosides-docusate sodium (SENOKOT-S) tablet 2 tablet, 2 tablet, Oral, BID  sotaloL (BETAPACE AF) tablet 80 mg, 80 mg, Oral, Q12H*  tamsulosin (FLOMAX) capsule 0.4 mg, 0.4 mg, Oral, QDAY after breakfast  warfarin (COUMADIN) tablet 3 mg, 3 mg, Oral, QHS    Continuous Infusions:   norepinephrine (LEVOPHED) 4 mg in dextrose 5% (D5W) 250 mL IV drip (std conc) 0.04 mcg/kg/min (07/24/23 1007)     PRN and Respiratory Meds:[START ON 07/27/2023] acetaminophen Q6H PRN **OR** [START ON 07/27/2023] acetaminophen Q6H PRN, alum-mag hydroxide-simeth Q4H PRN, bisacodyL QDAY PRN, dextrose 50% (D50) IV PRN, fentaNYL citrate PF Q1H PRN, hydrALAZINE  Q6H PRN, lidocaine PF PRN, magnesium sulfate PRN **OR** magnesium oxide PRN, milk of magnesium QDAY PRN, ondansetron Q6H PRN **OR** ondansetron (ZOFRAN) IV Q6H PRN, oxyCODONE Q4H PRN, potassium chloride PRN **OR** potassium chloride PRN **OR** potassium chloride in water PRN                       Vital Signs: Last Filed                  Vital Signs: 24 Hour Range   BP: 114/57 (11/05 1504)  Temp: 37.2 ?C (98.9 ?F) (11/06 0800)  Pulse: 106 (11/06 0922)  Respirations: 22 PER MINUTE (11/06 0922)  SpO2: 97 % (11/06 0922)  O2 Device: High flow nasal cannula (11/06 0900)  O2 Liter Flow: 1 Lpm (11/06 0900) BP: (114)/(57)   ABP: (87-141)/(39-59)   Temp:  [37 ?C (98.6 ?F)-37.2 ?C (99 ?F)]   Pulse:  [71-106]   Respirations:  [16 PER MINUTE-38 PER MINUTE]   SpO2:  [92 %-97 %]   O2 Device: High flow nasal cannula  O2 Liter Flow: 1 Lpm   Intensity Pain Scale (Self Report): 5 (07/24/23 0800) Vitals:    07/22/23 0613 07/23/23 0455 07/24/23 0500   Weight: 91.6 kg (201 lb 15.1 oz) 98 kg (216 lb 0.8 oz) 95.7 kg (210 lb 15.7 oz)           Intake/Output Summary:  (Last 24 hours)    Intake/Output Summary (Last 24 hours) at 07/24/2023 1010  Last data filed at 07/24/2023 0900  Gross per 24 hour   Intake 1314.03 ml   Output 1530 ml   Net -215.97 ml         Physical Exam:         Neuro: A&O x4  Cardiovascular: irreg   Respiratory: LS CTA bilaterally - diminished bases  GI: soft, NT, hypoactive BS  Extremities: No Edema  Incisions: Sternal incision dressing, dry and intact. No crepitus or sternal instability.      Chest Tubes OUTPUT/24 HOURS Suction AIR LEAK PRESENT   L 19 fr 210 mL bulb NA   24x2 fr 270 mL -20 No     Pertinent Meds:               Taking           Reason for Not Taking  1. Aspirin Yes  N/a   2. B-Blocker No No: Hypotension   3. Statin Yes  N/a   4. ACE/ARB No No: No Indication (LVEF >40%)       Artificial airway:  None                                                                                         Vent weaning trial:  Not applicable    LABS:  Recent Labs     07/22/23  1525 07/23/23  0252 07/24/23  0354   NA 139 139 135*   K 4.9 4.0 4.2   CL 112* 109 102   CO2 21 23 26    GAP 6 7 7    BUN 25 23 25    CR  0.87 0.73 0.62   GLU 212* 130* 129*   CA 7.5* 7.3* 8.3*   MG 2.6 2.5 2.1       Recent Labs     07/22/23  1525 07/23/23  0252 07/24/23  0354   WBC 10.9 10.6 11.8*   HGB 11.0* 10.7* 9.8*   HCT 32.3* 32.0* 28.5*   PLTCT 128* 155 128*   PT 12.3  --  12.5   INR 1.1  --  1.1   PTT 27.9  --   --       Estimated Creatinine Clearance: 109.3 mL/min (based on SCr of 0.62 mg/dL).  Vitals:    07/22/23 0613 07/23/23 0455 07/24/23 0500   Weight: 91.6 kg (201 lb 15.1 oz) 98 kg (216 lb 0.8 oz) 95.7 kg (210 lb 15.7 oz)      Recent Labs     07/22/23  2147 07/24/23  0932   PHART 7.37 7.43   PO2ART 111* 101*           Radiology and Other Diagnostic Procedures Review:    Reviewed

## 2023-07-24 NOTE — Progress Notes
Pod2 from cabg/avr. Significant pain control issues, feels like when he takes oxycodone it helps some but not lasting long enough. Ambulated yesterday in hallway but not this morning 2/2 pain. Brief run of afib this morning but back in sinus this AM. Bps stable, on 1L HFNC, CVP 8-10, marginal uop, off pressors. Labs stable. CXR with bibasilar atelectasis. Cts with 500 cc out, serosanguinous output.     - pain control - will try some valium this AM, added gabapentin, has prn oxycodone, lidoderm patches and fentanyl  - restart home sotalol  - continue chest tubes  - hold diuresis, may need spot dose later today  - tele status

## 2023-07-24 NOTE — Progress Notes
Endocrinology Progress Note    Today's Date:  07/24/2023  Admission Date: 07/22/2023    Reason for consult: DM managment  Type of Consult: Co-Management w/Signed Orders    Assessment:     Darius Cherry is a 72 yr old male with history of type 2 DM and CAD, hypertension, paroxysmal atrial fibrillation who underwent CABG x 3 and aortic valve replacement on 07/22/2023.      Type 2 Diabetes Mellitus, uncontrolled  Steroid-induced hyperglycemia     Diabetes History:  Dx: > 10 years ago  Outpatient Regimen:  Rybelsus 14mg  tablet daily, metformin XR 500mg  bid, Jardiance 25mg  daily  Outpatient provider:  PCP, Dr. Wilford Grist  A1c:  7.4% (06/10/2023)  prior A1c 9.5% in 04/2022     Coronary artery disease  Hypertension  Dyslipidemia     Glucose, POC   Date/Time Value Ref Range Status   07/24/2023 0811 131 (H) 70 - 100 MG/DL Final   32/44/0102 7253 121 (H) 70 - 100 MG/DL Final   66/44/0347 4259 135 (H) 70 - 100 MG/DL Final   56/38/7564 3329 137 (H) 70 - 100 MG/DL Final   51/88/4166 0630 141 (H) 70 - 100 MG/DL Final   16/09/930 3557 143 (H) 70 - 100 MG/DL Final   32/20/2542 7062 115 (H) 70 - 100 MG/DL Final   37/62/8315 1761 130 (H) 70 - 100 MG/DL Final      Diet: carb consistent    Recommendations:     BG trends reviewed.    BG at goal and well-controlled on insulin drip with low hourly rates.  Will transition off insulin drip     Stop insulin drip  Increase Lantus to 20 units daily (will give additional 5 units this AM to the 15 units already given to make 20 total units for 11/6)  increase Novolog to 6 units tid with meals, hold if NPO or not eating  Begin high-dose Novolog CF 5 x daily  Hold oral medications     Discharge planning:  - final regimen TBD  - he may or may not need new insulin at discharge; hopefully he can resume home medications  - would benefit from discussion on transitioning from Rybelsus to injectable GLP-1 - will discuss with him  - plan to have him f/u locally with PCP      Thank you for this consult, we will follow.  Please page me directly during 8am-5pm for questions, or the on-call endocrine fellow after 5pm.     Estill Bakes DO  Assistant Professor  Endocrinology, Diabetes & Metabolism  Pager: [phone] or available on Voalte     History of Present Illness    Darius Cherry is a 72 y.o. year old male.  He is seen resting in bedside chair, insulin drip running this morning. Glucose trends reviewed, will plan to transition off insulin drip this afternoon.       Estimated Creatinine Clearance: 109.3 mL/min (based on SCr of 0.62 mg/dL).    Allergies     Allergies   Allergen Reactions    Celebrex [Celecoxib] RASH and ITCHING       Medications   Scheduled Meds:acetaminophen (TYLENOL EXTRA STRENGTH) tablet 1,000 mg, 1,000 mg, Oral, Q6H while awake  albumin  5 % injection 250 mL, 250 mL, Intravenous, ONCE  ALBUMIN, HUMAN 5 % IV SOLP (Cabinet Override), , , NOW  aspirin chewable tablet 81 mg, 81 mg, Oral, QDAY  atorvastatin (LIPITOR) tablet 80 mg, 80 mg, Oral, QHS  insulin aspart (U-100) (NOVOLOG FLEXPEN U-100 INSULIN) injection PEN 0-24 Units, 0-24 Units, Subcutaneous, 5 X Daily  insulin aspart (U-100) (NOVOLOG FLEXPEN U-100 INSULIN) injection PEN 6 Units, 6 Units, Subcutaneous, TID w/ meals  [START ON 07/25/2023] insulin glargine (LANTUS SOLOSTAR U-100 INSULIN) injection PEN 20 Units, 20 Units, Subcutaneous, QDAY  lidocaine (LIDODERM) 5 % topical patch 1 patch, 1 patch, Topical, QDAY  polyethylene glycol 3350 (MIRALAX) packet 17 g, 1 packet, Oral, BID  sennosides-docusate sodium (SENOKOT-S) tablet 2 tablet, 2 tablet, Oral, BID  sotaloL (BETAPACE AF) tablet 80 mg, 80 mg, Oral, Q12H*  tamsulosin (FLOMAX) capsule 0.4 mg, 0.4 mg, Oral, QDAY after breakfast  warfarin (COUMADIN) tablet 3 mg, 3 mg, Oral, QHS    Continuous Infusions:   norepinephrine (LEVOPHED) 4 mg in dextrose 5% (D5W) 250 mL IV drip (std conc) 0.05 mcg/kg/min (07/24/23 1007)    vasopressin (VASOSTRICT) 20 units in 100 mL IV infusion (std conc)(premade) PRN and Respiratory Meds:[START ON 07/27/2023] acetaminophen Q6H PRN **OR** [START ON 07/27/2023] acetaminophen Q6H PRN, alum-mag hydroxide-simeth Q4H PRN, bisacodyL QDAY PRN, dextrose 50% (D50) IV PRN, fentaNYL citrate PF Q1H PRN, hydrALAZINE Q6H PRN, lidocaine PF PRN, magnesium sulfate PRN **OR** magnesium oxide PRN, milk of magnesium QDAY PRN, ondansetron Q6H PRN **OR** ondansetron (ZOFRAN) IV Q6H PRN, oxyCODONE Q4H PRN, potassium chloride PRN **OR** potassium chloride PRN **OR** potassium chloride in water PRN      Physical Examination                          Vital Signs: Last                  Vital Signs: 24 Hour Range   BP: 114/57 (11/05 1504)  Temp: 37.2 ?C (98.9 ?F) (11/06 0800)  Pulse: 108 (11/06 1000)  Respirations: 22 PER MINUTE (11/06 1000)  SpO2: 97 % (11/06 1000)  O2 Device: High flow nasal cannula (11/06 1000)  O2 Liter Flow: 1 Lpm (11/06 1000) BP: (114)/(57)   ABP: (87-141)/(39-59)   Temp:  [37 ?C (98.6 ?F)-37.2 ?C (99 ?F)]   Pulse:  [71-108]   Respirations:  [16 PER MINUTE-38 PER MINUTE]   SpO2:  [92 %-97 %]   O2 Device: High flow nasal cannula  O2 Liter Flow: 1 Lpm     GEN: Alert, cooperative, no acute distress.  HEAD: Normocephalic, atraumatic  PULM: No respiratory distress.  ABD: Non-distended.  SKIN: No rashes noted.  EXT: No edema, no cyanosis.     Lab Review     Point of Care Testing  (Last 24 hours)  Glucose: (!) 129 (07/24/23 0354)  POC Glucose (Download): (!) 131 (07/24/23 0811)    Recent Labs     07/22/23  1525 07/23/23  0252 07/24/23  0354   NA 139 139 135*   K 4.9 4.0 4.2   CL 112* 109 102   CO2 21 23 26    GAP 6 7 7    BUN 25 23 25    CR 0.87 0.73 0.62   GLU 212* 130* 129*   CA 7.5* 7.3* 8.3*   MG 2.6 2.5 2.1       Recent Labs     07/22/23  1525 07/23/23  0252 07/24/23  0354   WBC 10.9 10.6 11.8*   HGB 11.0* 10.7* 9.8*   HCT 32.3* 32.0* 28.5*   PLTCT 128* 155 128*   PT 12.3  --  12.5   INR 1.1  --  1.1  PTT 27.9  --   --       Estimated Creatinine Clearance: 109.3 mL/min (based on SCr of 0.62 mg/dL).  Vitals:    07/22/23 0613 07/23/23 0455 07/24/23 0500   Weight: 91.6 kg (201 lb 15.1 oz) 98 kg (216 lb 0.8 oz) 95.7 kg (210 lb 15.7 oz)        Thyroid Studies    Lab Results   Component Value Date/Time    TSH 3.17 12/11/2021 12:00 AM    No results found for: Edsel Petrin, [facility]     Pertinent radiology images reviewed.    Latrelle Dodrill, DO   Endocrine Faculty  07/24/2023

## 2023-07-24 NOTE — Progress Notes
Critical Care Progress Note          Today's Date:  07/24/2023  Name:  Darius Cherry                       MRN:  [id number]   Admission Date: 07/22/2023  LOS: 2 days                     Assessment/Plan:   Active Problems:    Hyperlipemia    Coronary artery disease involving native coronary artery of native heart without angina pectoris    Overweight (BMI 25.0-29.9)    Essential hypertension    Nonrheumatic aortic valve stenosis    Type 2 diabetes mellitus without complication, without long-term current use of insulin (HCC)    Paroxysmal atrial fibrillation (HCC)    S/P CABG x 3    S/P AVR (aortic valve replacement)    Acute blood loss anemia    Platelet dysfunction (HCC)    Thrombocytopenia (HCC)    Vasogenic shock (HCC)    Stress hyperglycemia    SIRS without infection or organ dysfunction (HCC)    Post-operative pain    Anticoagulated on warfarin      Date of Service: 07/24/2023    I have personally seen and cared for this patient in conjunction with the ICU team.  I provided or personally directed critical care services including Invasive Hemodynamic Management, Management of Cardiac Support Infusions, Comprehensive Pain Management, and Complex Fluid & Electrolyte Management and the patient is at risk for life threatening deterioration necessitating complex medical decision making and ongoing provision of ICU care.    Critical Care Time: 50 minutes    Gladis Riffle, MD      Patient in brief: 81M w/ HTN, T2DM, paroxysmal Afib, severe multivessel CAD, severe AS/mod AI now s/p CABGx3, tAVR, and LAAL. He presents to the CTICU intubated and sedated. Reportedly patient consumes several drinks per night PTA w/o reported hx of w/d symptoms.     Neuro:   Delirium precautions  Prn opiats, scheduled APAP, Lidoderm patches. Add robaxin for poor pain control.  AWAS monitoring, no adjuncts currently    Cardiac:   Post TEE: nml BiV function, well seated valve. Afib (11/5 -0400). Wean NE/vaso for MAP goal >65, SBP <130. No inotropes, goal CI >>2.2. cont ASA, statin. Resume PTA sotalol. 11/6 Afib early AM; consider DCCV this afternoon once appropriately NPO.     Pulmonary:   Goal SpO2 >92, wean NC as tolerated.   Aggressive pulmonary toilet, IS    GI:   ADAT  Bowel regimen    Endo:    goal blood glucose <180, endo consult for uncontrolled DM    Renal:    Baseline Scr 0.9. Monitor Cr and UOP  Goal even to negative 500, spot diurese prn    Heme:    Monitor H/H, transfuse hgb <7  Hold DVT ppx until POD 3.    ID:    Monitor WBC and for fever  Periop abx ppx    __________________________________________________________________________________  Subjective:  Darius Cherry is a 72 y.o. male.  Overnight Events: No new events noted.   Objective:  Medications:  Scheduled Meds:acetaminophen (TYLENOL EXTRA STRENGTH) tablet 1,000 mg, 1,000 mg, Oral, Q6H while awake  ALBUMIN, HUMAN 5 % IV SOLP (Cabinet Override), , , NOW  aspirin chewable tablet 81 mg, 81 mg, Oral, QDAY  atorvastatin (LIPITOR) tablet 80 mg, 80 mg, Oral, QHS  insulin aspart (U-100) (NOVOLOG FLEXPEN U-100 INSULIN) injection PEN 0-24 Units, 0-24 Units, Subcutaneous, 5 X Daily  insulin aspart (U-100) (NOVOLOG FLEXPEN U-100 INSULIN) injection PEN 6 Units, 6 Units, Subcutaneous, TID w/ meals  [START ON 07/25/2023] insulin glargine (LANTUS SOLOSTAR U-100 INSULIN) injection PEN 20 Units, 20 Units, Subcutaneous, QDAY  lidocaine (LIDODERM) 5 % topical patch 1 patch, 1 patch, Topical, QDAY  polyethylene glycol 3350 (MIRALAX) packet 17 g, 1 packet, Oral, BID  sennosides-docusate sodium (SENOKOT-S) tablet 2 tablet, 2 tablet, Oral, BID  sotaloL (BETAPACE AF) tablet 80 mg, 80 mg, Oral, Q12H*  tamsulosin (FLOMAX) capsule 0.4 mg, 0.4 mg, Oral, QDAY after breakfast  warfarin (COUMADIN) tablet 3 mg, 3 mg, Oral, QHS    Continuous Infusions:   norepinephrine (LEVOPHED) 4 mg in dextrose 5% (D5W) 250 mL IV drip (std conc) 0.02 mcg/kg/min (07/24/23 0934)     PRN and Respiratory Meds:[START ON 07/27/2023] acetaminophen Q6H PRN **OR** [START ON 07/27/2023] acetaminophen Q6H PRN, alum-mag hydroxide-simeth Q4H PRN, bisacodyL QDAY PRN, dextrose 50% (D50) IV PRN, fentaNYL citrate PF Q1H PRN, hydrALAZINE Q6H PRN, lidocaine PF PRN, magnesium sulfate PRN **OR** magnesium oxide PRN, milk of magnesium QDAY PRN, ondansetron Q6H PRN **OR** ondansetron (ZOFRAN) IV Q6H PRN, oxyCODONE Q4H PRN, potassium chloride PRN **OR** potassium chloride PRN **OR** potassium chloride in water PRN                     Vital Signs: Last Filed                  Vital Signs: 24 Hour Range   BP: 114/57 (11/05 1504)  ABP: 111/54 (11/06 0922)  Temp: 37.2 ?C (98.9 ?F) (11/06 0800)  Pulse: 106 (11/06 0922)  Respirations: 22 PER MINUTE (11/06 0922)  SpO2: 97 % (11/06 0922)  O2 Device: High flow nasal cannula (11/06 0900)  O2 Liter Flow: 1 Lpm (11/06 0900)  Weight: 95.7 kg (210 lb 15.7 oz) (11/06 0500)  BP: (114)/(57)   ABP: (87-141)/(39-59)   Temp:  [37 ?C (98.6 ?F)-37.2 ?C (99 ?F)]   Pulse:  [71-106]   Respirations:  [16 PER MINUTE-38 PER MINUTE]   SpO2:  [92 %-97 %]   O2 Device: High flow nasal cannula  O2 Liter Flow: 1 Lpm    Intensity Pain Scale (Self Report): (not recorded) Vitals:    07/22/23 0613 07/23/23 0455 07/24/23 0500   Weight: 91.6 kg (201 lb 15.1 oz) 98 kg (216 lb 0.8 oz) 95.7 kg (210 lb 15.7 oz)       Critical Care Vitals:      ICP Monitoring:     PA  Catheter:     Hemodynamics/Oxycalcs:  Hemodynamics/Oxycalcs  CVP: (!) 0 MM HG (07/24/23 0900)    Intake/Output Summary:  (Last 24 hours)    Intake/Output Summary (Last 24 hours) at 07/24/2023 1000  Last data filed at 07/24/2023 0900  Gross per 24 hour   Intake 1314.03 ml   Output 1530 ml   Net -215.97 ml           Lab Review:  Pertinent labs reviewed  Point of Care Testing:  (Last 24 hours):  Glucose: (!) 129 (07/24/23 0354)  POC Glucose (Download): (!) 131 (07/24/23 1914)    Radiology and Other Diagnostic Procedures Review:    Pertinent radiology reviewed.    Judy Pimple  Assistant Professor  Anesthesiology/Critical Care Medicine

## 2023-07-24 NOTE — Progress Notes
END OF SHIFT SUMMARY HC3    Admission Date: 07/22/2023  Length of Stay: LOS: 1 day    Patient demeanor and cognition: alert to droswy, oriented x4    Acute events, pain management, and nursing interventions: swan out, pain controlled with oxycodone 10mg     Mobility: Walk x2      If Other, please explain:     Pacing Requirements: No    Pressor Requirements: Yes- see eMAR  If Other, please explain:     CI > 2: N/A    Intake and Output:   Date 07/22/23 0701 - 07/23/23 0700 07/23/23 0701 - 07/24/23 0700   Shift 0701-1900 1901-0700 24 Hour Total 0701-1900 1901-0700 24 Hour Total   INTAKE   P.O.  980 980 820  820   I.V.(mL/kg/hr) 2130.8(6.5) 426(0.4) 2684.7(1.1) 228.8  228.8   Blood 149  149      Other  70 70      I.V.P.B./FLUSHES  100 100 250  250   Shift Total(mL/kg) 7846.9(62.9) 5284(13.2) 3983.7(40.7) 4401.0(27.2)  5366.4(40.3)   OUTPUT   Urine(mL/kg/hr) 980(0.9) 515(0.4) 1495(0.6) 605  605     Urine 655  655        Urine Output (mL) (Indwelling Urinary Catheter 16 FR Standard 2-way) 325 515 840 605  605   Drains 394 335 729 335  335     Drain Output (mL) (Chest Tube Medial Mediastinal 24 FR;X2 #1) 350 300 650 170  170     Drain Output (mL) (Chest Tube Left Pleural 19 FR #2) 24 35 59 165  165     Drain Output (mL) ([REMOVED] Temporary GI Tube Orogastric) 20  20      Shift Total(mL/kg) 1374(15) 850(8.7) 2224(22.7) 940(9.6)  940(9.6)   NET 1033.8 726 1759.7 358.8  358.8   Weight (kg) 91.6 98 98 98 98 98       Last Bowel Movement Date: 07/22/23       Patient Education  Patient education provided: Cardiac medications (specify which) and Early and frequent mobility post procedure  Other patient education provided:  Learners: Patient  Method/materials used: Verbal teaching  Response to learning: Some Evidence of Learning, Needs Reinforcement      Patient Goal(s):  Patient will Be able to ambulate without breathing difficulty by discharge date         Patient will  Verbalize pain has improved by discharge date   Other:

## 2023-07-25 ENCOUNTER — Inpatient Hospital Stay: Admit: 2023-07-25 | Discharge: 2023-07-25 | Payer: MEDICARE

## 2023-07-25 LAB — ECG 12-LEAD
Q-T INTERVAL: 396 ms
QTC CALCULATION (BAZETT): 471 ms
T AXIS: -31 degrees
VENTRICULAR RATE: 85 {beats}/min

## 2023-07-25 LAB — POC GLUCOSE
POC GLUCOSE: 221 mg/dL — ABNORMAL HIGH (ref 70–100)
POC GLUCOSE: 172 mg/dL — ABNORMAL HIGH (ref 70–100)
POC GLUCOSE: 184 mg/dL — ABNORMAL HIGH (ref 70–100)
POC GLUCOSE: 207 mg/dL — ABNORMAL HIGH (ref 70–100)
POC GLUCOSE: 135 mg/dL — ABNORMAL HIGH (ref 70–100)

## 2023-07-25 LAB — CBC
MPV: 9.8 FL — ABNORMAL LOW (ref 7–11)
RDW: 13 % — ABNORMAL HIGH (ref 11–15)

## 2023-07-25 MED ORDER — MELATONIN 5 MG PO TAB
5 mg | Freq: Every evening | ORAL | 0 refills | Status: DC | PRN
Start: 2023-07-25 — End: 2023-07-30
  Administered 2023-07-26 – 2023-07-29 (×3): 5 mg via ORAL

## 2023-07-25 MED ORDER — INSULIN ASPART 100 UNIT/ML SC FLEXPEN
0-12 [IU] | Freq: Every day | SUBCUTANEOUS | 0 refills | Status: DC
Start: 2023-07-25 — End: 2023-07-30
  Administered 2023-07-26: 12:00:00 2 [IU] via SUBCUTANEOUS
  Administered 2023-07-27: 18:00:00 4 [IU] via SUBCUTANEOUS

## 2023-07-25 MED ORDER — LORAZEPAM 2 MG/ML IJ SOLN GROUP
.25 mg | INTRAVENOUS | 0 refills | Status: DC | PRN
Start: 2023-07-25 — End: 2023-07-25

## 2023-07-25 MED ORDER — LORAZEPAM 2 MG/ML IJ SOLN GROUP
.25 mg | Freq: Once | INTRAVENOUS | 0 refills | Status: CP
Start: 2023-07-25 — End: ?
  Administered 2023-07-25: 15:00:00 0.25 mg via INTRAVENOUS

## 2023-07-25 MED ORDER — LORAZEPAM 2 MG/ML IJ SOLN GROUP
.25 mg | INTRAVENOUS | 0 refills | Status: AC | PRN
Start: 2023-07-25 — End: ?
  Administered 2023-07-26 (×2): 0.25 mg via INTRAVENOUS

## 2023-07-25 MED ORDER — METHYLNALTREXONE 12 MG/0.6 ML SC SOLN
12 mg | Freq: Once | SUBCUTANEOUS | 0 refills | Status: CP
Start: 2023-07-25 — End: ?
  Administered 2023-07-25: 15:00:00 12 mg via SUBCUTANEOUS

## 2023-07-25 MED ADMIN — SODIUM CHLORIDE 0.9% IV SOLP [27838]: 1000 mL | INTRA_ARTERIAL | @ 03:00:00 | Stop: 2023-07-25 | NDC 00338004904

## 2023-07-25 NOTE — Progress Notes
Pod3 from cabg/avr. Pain control much improved. Ambulated in hallway yesterday. Unable to sleep overnight in the ICU. Afebrile, in afib rates 80s, bps 100-130/40-60, maps 63+. CVP 9-14 off norepi and vaso. Net +310 cc for last 24 hrs, 275 out of chest tubes.   Labs w/ hb 7.9 from 9.8, cr 0.67 from 0.62, inr 1.1. CXR w/ significant pulm edema    - diurese today, up 4kg since admission  - pull foley catheter after diuresing  - pull chest tubes after ambulation if does not dump  - beta blocker when able (may not be today)  - tele status if able to ambulate w/o pressor

## 2023-07-25 NOTE — Progress Notes
Cardiothoracic Surgery Critical Care Progress Note     Darius Cherry  ZOXWR'U Date:  07/25/2023  Admission Date: 07/22/2023  LOS: 3 days    Procedure date: 11/4  Procedure(s):  Coronary artery bypass grafting x3, left internal mammary artery take down, endoscopic vein harvest, Aortic Valve Replacement- tissue, left atrial appendage ligation.  POD#: 3    Active Problems:    Hyperlipemia    Coronary artery disease involving native coronary artery of native heart without angina pectoris    Overweight (BMI 25.0-29.9)    Essential hypertension    Nonrheumatic aortic valve stenosis    Type 2 diabetes mellitus without complication, without long-term current use of insulin (HCC)    Paroxysmal atrial fibrillation (HCC)    S/P CABG x 3    S/P AVR (aortic valve replacement)    Acute blood loss anemia    Platelet dysfunction (HCC)    Thrombocytopenia (HCC)    Vasogenic shock (HCC)    Stress hyperglycemia    SIRS without infection or organ dysfunction (HCC)    Post-operative pain    Anticoagulated on warfarin    Atrial fibrillation status post cardioversion [facility])      Assessment/Plan:      Neuro - Pain controlled. Continue PRN oxycodone with scheduled Tylenol, robaxin & Lidoderm patches. Hx of daily ETOH use (6 drinks daily, last on 10/31).  Mild agitation this AM, with diaphoresis, tachycardia, borderline febrile. Suspect some withdrawal, responded well to 0.25mg  IV ativan. Add Q6hr PRN for s/s of withdrawal. Has been somewhat somnolent overall since surgery.      CV - SR--> AFib ~ 0600 on 11/6. Attempted DCCV x2 on 11/6, unsuccessful. Remains AF, rate 80-90s overnight, up to 120 this AM with agitation. Continue PTA sotalol. Acutely hypotensive 11/6 requiring IVF, NE & Vaso. All pressors off early AM, BP 100-140s/40-60s. Maintain MAP > 65. CVP 6-8, overnight readings inaccurate.   Cont ASA, statin. No Amio ppx. Hold BB due to recent pressor.  Post-procedure TEE LVEF 55-60%.    Resp - SpO2 99% on 1L. Wean O2. Daily CXR; reviewed radiology report. Cont IS, aggressive pulm toilet. Chest tubes plan: remove 11/7.       Renal - Baseline sCr. 0.9. UOP: , net +39mL/24hrs. Has not received diuresis post op d/t low filling pressures, appears to be autodiuresing this AM. Will spot dose this afternoon if UOP slows. Cont PTA flomax.  Discontinue foley catheter when hemodynamically stable and appropriate.     GI - ADAT, Last BM: preop, continue post op bowel regimen. S/p Relistor 11/6, repeat 11/7.     ID - Afebrile. WBC: 7.7. Completed peri op abx.     Heme - Hgb 9.8->7.9. Likely dilutional, recheck this afternoon.  Continue mechanical DVT prophylaxis. Holding PTA Eliquis, started on Warfarin 11/5, continue QHS. Daily INR. Hold off on Heparin bridge for AFib per CTS.     FEN - replace Mg and K to minimize the risk of cardiac arrhythmias. A1c 7.4%, Endocrine following- appreciate assistance. Currently on SSI TID aspart & Lantus.    Activity - Pt up to chair today, should ambulate in halls with nursing and cardiac rehab TID.      Disposition: Plan as above. Continue ICU care while pt critically ill with Invasive Hemodynamic Management, Management of Vasogenic Shock, Comprehensive Pain Management, and Complex Fluid & Electrolyte Management.    I have seen, personally fully evaluated, and discussed patient with critical care attending Dr. Judy Pimple and the cardiothoracic surgeon.  The patient is critically ill and at risk for life threatening deterioration. I spent 55 minutes (excluding time spent performing or supervising any procedures) providing and personally directing critical care services.     Prophylaxis Review:  Lines:  Yes; Arterial Line; Indication:  Frequent blood draws and Continuous BP monitoring; Location:  Radial  Central Line; Indication:  Med not deliverable peripherally, Incompatability of meds, and Hemodynamic monitoring; Type:  Internal jugular  Antibiotic Usage:  No  VTE: Pharmacological prophylaxis: Warfarin and Mechanical prophylaxis: Foot pump  Urinary Catheter: Yes; Retain foley due to:  Need for accurate Intake and Output  Temporary Pacing Wires Present?: No Date Removed: 11/7    Normajean Glasgow, APRN-NP   [facility] Intensive Care  Pager [phone]  07/25/2023    Subjective:       HPI:   Darius Cherry is a 72 y.o. male whose current medical conditions include CAD s/p PCI to circumflex in 2007 and then PCI to circumflex and LAD in 2013, paroxysmal afib on apixaban, HTN, HLD, DM2, severe aortic stenosis (follows with Dr. Barry Dienes). Referred to [facility] valve clinic. Found to have CAD. He is now s/p CABG/AVR (t) with Dr. Sindy Messing.      11/5: wean norepi, dc PAC, possible tele   11/6: Afib, low BP, +volume and pressor, resume sotalol, relistor, pain control, failed DCCV x2  11/7: likely withdrawal, +PRN ativan, DC wires/CTs, spot diuresis, repeat relistor, no heparin bridge per CTS    Review of Systems   Constitutional:  Positive for diaphoresis and malaise/fatigue. Negative for chills and fever.   Eyes:  Negative for blurred vision and double vision.   Respiratory:  Negative for cough and shortness of breath.    Cardiovascular:  Positive for chest pain. Negative for palpitations and leg swelling.        Positive for left sided chest pain related to chest tube    Gastrointestinal:  Negative for abdominal pain, nausea and vomiting.   Neurological:  Negative for dizziness, tremors, sensory change, speech change, focal weakness and headaches.   Psychiatric/Behavioral:  The patient is not nervous/anxious.           Objective:        Medications:  Scheduled Meds:acetaminophen (TYLENOL EXTRA STRENGTH) tablet 1,000 mg, 1,000 mg, Oral, Q6H while awake  aspirin chewable tablet 81 mg, 81 mg, Oral, QDAY  atorvastatin (LIPITOR) tablet 80 mg, 80 mg, Oral, QHS  insulin aspart (U-100) (NOVOLOG FLEXPEN U-100 INSULIN) injection PEN 0-24 Units, 0-24 Units, Subcutaneous, 5 X Daily  insulin aspart (U-100) (NOVOLOG FLEXPEN U-100 INSULIN) injection PEN 6 Units, 6 Units, Subcutaneous, TID w/ meals  insulin glargine (LANTUS SOLOSTAR U-100 INSULIN) injection PEN 20 Units, 20 Units, Subcutaneous, QDAY  lidocaine (LIDODERM) 5 % topical patch 1 patch, 1 patch, Topical, QDAY  methocarbamoL (ROBAXIN) tablet 750 mg, 750 mg, Oral, Q8H  polyethylene glycol 3350 (MIRALAX) packet 17 g, 1 packet, Oral, BID  sennosides-docusate sodium (SENOKOT-S) tablet 2 tablet, 2 tablet, Oral, BID  sotaloL (BETAPACE AF) tablet 80 mg, 80 mg, Oral, Q12H*  tamsulosin (FLOMAX) capsule 0.4 mg, 0.4 mg, Oral, QDAY after breakfast  warfarin (COUMADIN) tablet 3 mg, 3 mg, Oral, QHS    Continuous Infusions:   norepinephrine (LEVOPHED) 4 mg in dextrose 5% (D5W) 250 mL IV drip (std conc) Stopped (07/24/23 1800)    vasopressin (VASOSTRICT) 20 units in 100 mL IV infusion (std conc)(premade) Stopped (07/25/23 0300)     PRN and Respiratory Meds:[START ON 07/27/2023] acetaminophen Q6H PRN **  OR** [START ON 07/27/2023] acetaminophen Q6H PRN, alum-mag hydroxide-simeth Q4H PRN, bisacodyL QDAY PRN, dextrose 50% (D50) IV PRN, fentaNYL citrate PF Q1H PRN, hydrALAZINE Q6H PRN, lidocaine PF PRN, LORazepam  (ATIVAN)  injection Q6H PRN, magnesium sulfate PRN **OR** magnesium oxide PRN, milk of magnesium QDAY PRN, ondansetron Q6H PRN **OR** ondansetron (ZOFRAN) IV Q6H PRN, oxyCODONE Q4H PRN, potassium chloride PRN **OR** potassium chloride PRN **OR** potassium chloride in water PRN                       Vital Signs: Last Filed                  Vital Signs: 24 Hour Range   BP: 114/67 (11/07 0648)  Temp: 37.8 ?C (100 ?F) (11/07 0800)  Pulse: 81 (11/07 1000)  Respirations: 24 PER MINUTE (11/07 1000)  SpO2: 99 % (11/07 1000)  O2 Device: High flow nasal cannula (11/07 0930)  O2 Liter Flow: 1 Lpm (11/07 0930) BP: (114)/(67)   ABP: (100-155)/(46-65)   Temp:  [36.6 ?C (97.8 ?F)-37.8 ?C (100 ?F)]   Pulse:  [76-120]   Respirations:  [12 PER MINUTE-32 PER MINUTE]   SpO2:  [92 %-100 %]   O2 Device: High flow nasal cannula  O2 Liter Flow: 1 Lpm   Intensity Pain Scale (Self Report): 4 (07/25/23 0900) Vitals:    07/23/23 0455 07/24/23 0500 07/25/23 0500   Weight: 98 kg (216 lb 0.8 oz) 95.7 kg (210 lb 15.7 oz) 94.7 kg (208 lb 12.4 oz)           Intake/Output Summary:  (Last 24 hours)    Intake/Output Summary (Last 24 hours) at 07/25/2023 1123  Last data filed at 07/25/2023 1000  Gross per 24 hour   Intake 1904.48 ml   Output 1940 ml   Net -35.52 ml         Physical Exam:         Neuro: A&O x4  Cardiovascular: irreg   Respiratory: LS CTA bilaterally - diminished bases  GI: soft, NT, hypoactive BS  Extremities: No Edema  Incisions: Sternal incision dressing, dry and intact. No crepitus or sternal instability.        Pertinent Meds:               Taking           Reason for Not Taking  1. Aspirin Yes  N/a   2. B-Blocker No No: Hypotension   3. Statin Yes  N/a   4. ACE/ARB No No: No Indication (LVEF >40%)       Artificial airway:  None                                                                                         Vent weaning trial:  Not applicable    LABS:  Recent Labs     07/22/23  1525 07/23/23  0252 07/24/23  0354 07/25/23  0257   NA 139 139 135* 133*   K 4.9 4.0 4.2 4.3   CL 112* 109 102 100   CO2 21 23 26 28    GAP  6 7 7 5    BUN 25 23 25  31*   CR 0.87 0.73 0.62 0.67   GLU 212* 130* 129* 162*   CA 7.5* 7.3* 8.3* 8.1*   MG 2.6 2.5 2.1 2.0       Recent Labs     07/22/23  1525 07/23/23  0252 07/24/23  0354 07/25/23  0257   WBC 10.9 10.6 11.8* 7.7   HGB 11.0* 10.7* 9.8* 7.9*   HCT 32.3* 32.0* 28.5* 23.0*   PLTCT 128* 155 128* 97*   PT 12.3  --  12.5 12.2   INR 1.1  --  1.1 1.1   PTT 27.9  --   --   --       Estimated Creatinine Clearance: 108.8 mL/min (based on SCr of 0.67 mg/dL).  Vitals:    07/23/23 0455 07/24/23 0500 07/25/23 0500   Weight: 98 kg (216 lb 0.8 oz) 95.7 kg (210 lb 15.7 oz) 94.7 kg (208 lb 12.4 oz)      Recent Labs     07/22/23  2147 07/24/23  0932   PHART 7.37 7.43   PO2ART 111* 101*           Radiology and Other Diagnostic Procedures Review:    Reviewed

## 2023-07-25 NOTE — Unmapped
Procedure Note - Cardioversion:    Confirmed correct: Patient and Procedure  Time: 1634  Conscious sedation: Yes   Rx: propofol 80 mg  Rhythm: Atrial Fibrillation 110Sync  Number of shocks required: #2@200   Successful Voltage: unsuccessful      Gladis Riffle, MD

## 2023-07-25 NOTE — Progress Notes
PHYSICAL THERAPY  ASSESSMENT      Name: Darius Cherry   MRN: [id number]     DOB: 1950/10/11      Age: 72 y.o.  Admission Date: 07/22/2023     LOS: 3 days     Date of Service: 07/25/2023      Mobility  Patient Turn/Position: Supine  Progressive Mobility Level: Walk in hallway  Distance Walked (feet): 200 ft  Level of Assistance: Assist X1  Assistive Device: Walker  Activity Limited By: Fatigue    Subjective  Significant Hospital Events: s/p CABGx3 and RVR 11/04  Special Considerations: Current oxygen requirement  Comments: 3L with ambulation  Mental / Cognitive: Alert;Oriented  Pain: Complains of pain;Does not rate pain  Pain level: Before activity;During activity;5/10  Pain Location: Post-surgical  Pain Interventions: Patient agrees to participate in therapy with current pain level;Patient assisted into position of comfort  Persons Present: Occupational Therapist;Nursing Staff    Home Living Situation  Lives With: Spouse/significant other  Type of Home: House  Entry Stairs: 2  In-Home Stairs: Able to live on main level  Bathroom Setup: Tub/shower unit  Patient Owned Equipment: Environmental consultant with wheels  Comments: Reports he owns a walker, but was not using prior to admission.    Prior Level of Function  Level Of Independence: Independent with ADL and community mobility without device  History of Falls in Past 3 Months: No    Precautions  Precautions: Sternal precautions    ROM  R LE ROM: WFL  R LE ROM Method: Active  L LE ROM: WFL  L LE ROM Method: Active    Strength  Overall Strength: WFL    Bed Mobility/Transfer  Bed Mobility: Sit to Supine: Minimal Assist;x2 People;Bed Flat    Transfer Type: Sit to Stand  Transfer: Assistance Level: From;Bed Side Chair;Moderate Assist  Transfer: Assistive Device: None  Transfers: Type Of Assistance: Verbal Cues;To Maintain Precautions;For Balance;For Strength Deficit;For Safety Considerations    Other Transfer Type: Stand to Sit  Other Transfer: Assistance Level: To;Bed;Minimal Assist  Other Transfer: Assistive Device: None  Other Transfer: Type Of Assistance: Verbal Cues;For Safety Considerations    End Of Activity Status: In Bed;Nursing Notified;Instructed Patient to Request Assist with Mobility;Instructed Patient to Use Call Light    Balance  Sitting Balance: No UE Support;Standby Assist  Standing Balance: 2 UE support;Standby Assist    Gait  Gait Distance: 200 feet  Gait: Assistance Level: Minimal Assist;of 1st person;Standby Assist;of 2nd person;Management of Lines  Gait: Assistive Device: Roller Walker  Gait: Descriptors: Pace: Slow;No balance loss;Decreased step length;Decreased foot clearance RLE;Decreased foot clearance LLE  Comments: RN present for duration of session. No pressor support required (MAP ranged from 57-65 throughout).  Activity Limited By: Complaint of Fatigue    Education  Persons Educated: Patient  Patient Barriers To Learning: None Noted  Teaching Methods: Verbal Instruction  Patient Response: Verbalized Understanding  Topics: Plan/Goals of PT Interventions;Use of Assistive Device/Orthosis;Mobility Progression;Precautions;Up with Assist Only;Importance of Increasing Activity    Assessment/Progress  Impaired Mobility Due To: Pain;Decreased Activity Tolerance;Post Surgical Changes;Medical Status Limitation  Assessment/Progress: Should Improve w/ Continued PT  Comments: Patient presents with balance and endurance deficits. Will benefit from ongoing PT to improve functional mobility.    AM-PAC 6 Clicks Basic Mobility Inpatient  Turning from your back to your side while in a flat bed without using bed rails: A Little  Moving from lying on your back to sitting on the side of a flat bed without  using bedrails : A Little  Moving to and from a bed to a chair (including a wheelchair): A Little  Standing up from a chair using your arms (e.g. wheelchair, or bedside chair): A Little  To walk in hospital room: A Little  Climbing 3-5 steps with a railing: A Lot  Basic Mobility Inpatient Raw Score: 17  Standardized (T-scale) Score: 39.67    Goals  Goal Formulation: With Patient  Time For Goal Achievement: 3 days, To, 5 days  Patient Will Go Supine To/From Sit: w/ Stand By Assist  Patient Will Transfer Bed/Chair: w/ Stand By Assist  Patient Will Ambulate: Greater than 200 Feet, w/ Dan Humphreys, w/ Stand By Assist  Patient Will Go Up / Down Stairs: 1-2 Stairs, w/ Stand By Assist    Plan  Treatment Interventions: Mobility training;Balance activities;Endurance training  Plan Frequency: 1-2 Days per Week  PT Plan for Next Visit: Progress gait, trial steps    PT Discharge Recommendations  Recommendation: Home with intermittent supervision/assistance  Patient Currently Requires Equipment: Owns what is needed    Therapist  Harriett Rush, PT, DPT  Date  07/25/2023

## 2023-07-25 NOTE — Progress Notes
Critical Care Progress Note          Today's Date:  07/25/2023  Name:  Darius Cherry                       MRN:  [id number]   Admission Date: 07/22/2023  LOS: 3 days                     Assessment/Plan:   Active Problems:    Hyperlipemia    Coronary artery disease involving native coronary artery of native heart without angina pectoris    Overweight (BMI 25.0-29.9)    Essential hypertension    Nonrheumatic aortic valve stenosis    Type 2 diabetes mellitus without complication, without long-term current use of insulin (HCC)    Paroxysmal atrial fibrillation (HCC)    S/P CABG x 3    S/P AVR (aortic valve replacement)    Acute blood loss anemia    Platelet dysfunction (HCC)    Thrombocytopenia (HCC)    Vasogenic shock (HCC)    Stress hyperglycemia    SIRS without infection or organ dysfunction (HCC)    Post-operative pain    Anticoagulated on warfarin    Atrial fibrillation status post cardioversion [facility])      Date of Service: 07/25/2023    I have personally seen and cared for this patient in conjunction with the ICU team.  I provided or personally directed critical care services including Invasive Hemodynamic Management, Management of Cardiac Support Infusions, Comprehensive Pain Management, and Complex Fluid & Electrolyte Management and the patient is at risk for life threatening deterioration necessitating complex medical decision making and ongoing provision of ICU care.    Critical Care Time: 41 minutes    Darius Riffle, MD      Patient in brief: 25M w/ HTN, T2DM, paroxysmal Afib, severe multivessel CAD, severe AS/mod AI now s/p CABGx3, tAVR, and LAAL. He presents to the CTICU intubated and sedated. Reportedly patient consumes several drinks per night PTA w/o reported hx of w/d symptoms.     Neuro:   Delirium precautions  Prn opiats, scheduled APAP, Lidoderm patches. Add robaxin for poor pain control.  AWAS monitoring, no adjuncts currently    Cardiac:   Post TEE: nml BiV function, well seated valve. Afib (11/5 -0400). Wean NE/vaso for MAP goal >65, SBP <130. No inotropes, goal CI >>2.2. cont ASA, statin. Resume PTA sotalol. 11/6 Afib early 11/6, DCCV attempted x2 w/o resolution. Remains rate controlled.    Pulmonary:   Goal SpO2 >92, wean NC as tolerated.   Aggressive pulmonary toilet, IS    GI:   ADAT  Bowel regimen    Endo:    goal blood glucose <180, endo consult for uncontrolled DM    Renal:    Baseline Scr 0.9. Monitor Cr and UOP  Goal even to negative 500, spot diurese prn    Heme:    Monitor H/H, transfuse hgb <7    ID:    Monitor WBC and for fever  Periop abx ppx per protocol    __________________________________________________________________________________  Subjective:  Darius Cherry is a 72 y.o. male.  Overnight Events: No new events noted.   Objective:  Medications:  Scheduled Meds:acetaminophen (TYLENOL EXTRA STRENGTH) tablet 1,000 mg, 1,000 mg, Oral, Q6H while awake  aspirin chewable tablet 81 mg, 81 mg, Oral, QDAY  atorvastatin (LIPITOR) tablet 80 mg, 80 mg, Oral, QHS  insulin aspart (U-100) (NOVOLOG FLEXPEN U-100 INSULIN) injection  PEN 0-24 Units, 0-24 Units, Subcutaneous, 5 X Daily  insulin aspart (U-100) (NOVOLOG FLEXPEN U-100 INSULIN) injection PEN 6 Units, 6 Units, Subcutaneous, TID w/ meals  insulin glargine (LANTUS SOLOSTAR U-100 INSULIN) injection PEN 20 Units, 20 Units, Subcutaneous, QDAY  lidocaine (LIDODERM) 5 % topical patch 1 patch, 1 patch, Topical, QDAY  methocarbamoL (ROBAXIN) tablet 750 mg, 750 mg, Oral, Q8H  polyethylene glycol 3350 (MIRALAX) packet 17 g, 1 packet, Oral, BID  sennosides-docusate sodium (SENOKOT-S) tablet 2 tablet, 2 tablet, Oral, BID  sotaloL (BETAPACE AF) tablet 80 mg, 80 mg, Oral, Q12H*  tamsulosin (FLOMAX) capsule 0.4 mg, 0.4 mg, Oral, QDAY after breakfast  warfarin (COUMADIN) tablet 3 mg, 3 mg, Oral, QHS    Continuous Infusions:   norepinephrine (LEVOPHED) 4 mg in dextrose 5% (D5W) 250 mL IV drip (std conc) Stopped (07/24/23 1800)    vasopressin (VASOSTRICT) 20 units in 100 mL IV infusion (std conc)(premade) Stopped (07/25/23 0300)     PRN and Respiratory Meds:[START ON 07/27/2023] acetaminophen Q6H PRN **OR** [START ON 07/27/2023] acetaminophen Q6H PRN, alum-mag hydroxide-simeth Q4H PRN, bisacodyL QDAY PRN, dextrose 50% (D50) IV PRN, hydrALAZINE Q6H PRN, lidocaine PF PRN, LORazepam  (ATIVAN)  injection Q6H PRN, magnesium sulfate PRN **OR** magnesium oxide PRN, milk of magnesium QDAY PRN, ondansetron Q6H PRN **OR** ondansetron (ZOFRAN) IV Q6H PRN, oxyCODONE Q4H PRN, potassium chloride PRN **OR** potassium chloride PRN **OR** potassium chloride in water PRN                     Vital Signs: Last Filed                  Vital Signs: 24 Hour Range   BP: 114/67 (11/07 0648)  ABP: 123/55 (11/07 1000)  Temp: 37.8 ?C (100 ?F) (11/07 0800)  Pulse: 81 (11/07 1000)  Respirations: 24 PER MINUTE (11/07 1000)  SpO2: 99 % (11/07 1000)  O2 Device: High flow nasal cannula (11/07 0930)  O2 Liter Flow: 1 Lpm (11/07 0930)  Weight: 94.7 kg (208 lb 12.4 oz) (11/07 0500)  BP: (114)/(67)   ABP: (100-155)/(46-65)   Temp:  [36.7 ?C (98 ?F)-37.8 ?C (100 ?F)]   Pulse:  [76-120]   Respirations:  [12 PER MINUTE-30 PER MINUTE]   SpO2:  [92 %-100 %]   O2 Device: High flow nasal cannula  O2 Liter Flow: 1 Lpm    Intensity Pain Scale (Self Report): (not recorded) Vitals:    07/23/23 0455 07/24/23 0500 07/25/23 0500   Weight: 98 kg (216 lb 0.8 oz) 95.7 kg (210 lb 15.7 oz) 94.7 kg (208 lb 12.4 oz)       Critical Care Vitals:      ICP Monitoring:     PA  Catheter:     Hemodynamics/Oxycalcs:  Hemodynamics/Oxycalcs  CVP: 6 MM HG (07/25/23 1000)    Intake/Output Summary:  (Last 24 hours)    Intake/Output Summary (Last 24 hours) at 07/25/2023 1210  Last data filed at 07/25/2023 1000  Gross per 24 hour   Intake 1890.91 ml   Output 1850 ml   Net 40.91 ml           Lab Review:  Pertinent labs reviewed  Point of Care Testing:  (Last 24 hours):  Glucose: (!) 162 (07/25/23 0257)  POC Glucose (Download): (!) 184 (07/25/23 1610)    Radiology and Other Diagnostic Procedures Review:    Pertinent radiology reviewed.    Darius Cherry  Assistant Professor  Anesthesiology/Critical Care Medicine

## 2023-07-25 NOTE — Progress Notes
END OF SHIFT SUMMARY HC3    Admission Date: 07/22/2023  Length of Stay: LOS: 3 days    Patient demeanor and cognition: pt axox4, calm and cooperative. Pt seems irritable at times. Pt asking for water and breakfast this AM.     Acute events, pain management, and nursing interventions: moderate pain reported overnight, pt appears to have slept well overnight but does not report that he slept well. Pt drowsy but easily arousable. Pt received PRN oxycodone x1 and scheduled robaxin. Pt has not had a BM since prior to admission. Pt off and on pressor overnight to keep MAP >65. Arterial line positional at times.     Mobility: Up to Chair      If Other, please explain:     Pacing Requirements: No    Pressor Requirements: Yes- see eMAR  If Other, please explain:     CI > 2: N/A    Intake and Output:   Date 07/24/23 0701 - 07/25/23 0700 07/25/23 0701 - 07/26/23 0700   Shift 0701-1900 1901-0700 24 Hour Total 0701-1900 1901-0700 24 Hour Total   INTAKE   P.O. 250 1158 1408      I.V.(mL/kg/hr) 216.8(0.2) 69.1(0.1) 285.9(0.1)      I.V.P.B./FLUSHES 750  750      Shift Total(mL/kg) 1216.8(12.7) 1227.1(13) 2443.9(25.8)      OUTPUT   Urine(mL/kg/hr) 455(0.4) 795(0.7) 1250(0.5)        Urine Output (mL) (Indwelling Urinary Catheter 16 FR Standard 2-way) [phone]      Drains 210 105 315        Drain Output (mL) (Chest Tube Medial Mediastinal 24 FR;X2 #1) 145 70 215        Drain Output (mL) (Chest Tube Left Pleural 19 FR #2) 65 35 100      Shift Total(mL/kg) 665(6.9) 900(9.5) 1565(16.5)      NET 551.8 327.1 878.9      Weight (kg) 95.7 94.7 94.7 94.7 94.7 94.7       Last Bowel Movement Date: 07/22/23

## 2023-07-25 NOTE — Progress Notes
OCCUPATIONAL THERAPY  ASSESSMENT AND DISCHARGE NOTE    Name: Darius Cherry   MRN: [id number]     DOB: Jul 14, 1951      Age: 72 y.o.  Admission Date: 07/22/2023     LOS: 3 days     Date of Service: 07/25/2023    Mobility  Patient Turn/Position: Supine  Progressive Mobility Level: Walk in hallway  Distance Walked (feet): 200 ft  Level of Assistance: Assist X1  Assistive Device: Walker  Activity Limited By: Fatigue    Subjective  Significant Hospital Events: s/p CABGx3 and RVR 11/04  Special Considerations: Current oxygen requirement  Comments: 3L with ambulation  Mental / Cognitive: Alert;Oriented  Pain Interventions: Patient agrees to participate in therapy with current pain level  Persons Present: Nursing Staff;Physical Therapist    Home Living Situation  Lives With: Spouse/significant other  Type of Home: House  Entry Stairs: 2  In-Home Stairs: Able to live on main level  Bathroom Setup: Tub/shower unit  Patient Owned Equipment: Environmental consultant with wheels  Comments: Reports he owns a walker, but was not using prior to admission.    Prior Level of Function  Level Of Independence: Independent with ADL and community mobility without device  History of Falls in Past 3 Months: No    Precautions  Precautions: Sternal precautions    ADL Mobility  Bed Mobility: Sit to Supine: Minimal assist;x2 people  Transfer Type: Sit to stand  Transfer: Assistance Level: From;Bedside chair;Moderate assist  Transfer: Assistive Device: Roller walker  Transfer: Type of Assistance: For safety considerations;For strength deficit;Requires extra time  Other Transfer Type: Stand to sit  Other Transfer: Assistance Level: To;Bed;Minimal assist  Other Transfer: Assistive Device: Roller walker  Other Transfer: Type of Assistance: For safety considerations;Requires extra time  End of Activity Status: In bed;Instructed patient to request assist with mobility;Nursing notified  Gait Distance: 200 feet  Gait: Assistance Level: Minimal assist;of 1st person;Safety considerations;Management of lines;of 2nd person  Gait: Assistive Device: Roller walker    Activity Tolerance  Comment: RN present for duration of session. No pressor support required (MAP ranged from 57-65 throughout).  Modified Rate of Perceived Exertion (RPE): 3 out of 10.     Cognition  Overall Cognitive Status: WFL to Adequately Complete Self Care Tasks Safely  Orientation: Alert & Oriented x4     Education  Persons Educated: Patient  Teaching Methods: Verbal Instruction  Patient Response: Verbalized Understanding  Topics: Role of OT, Goals for Therapy    Assessment  Assessment: Decreased ADL Status;Decreased Endurance  Comments: Patient's current level of function suggests that patient will progress with one discipline. OT will sign off at this time. Please re-consult if a change in functional status occurs.    AM-PAC 6 Clicks Daily Activity Inpatient  Putting on and taking off regular lower body clothes: A Lot  Bathing (Including washing, rinsing, drying): A Lot  Toileting, which includes using toilet, bedpan, or urinal: Total  Putting on and taking off regular upper body clothing: A Little  Taking care of personal grooming such as brushing teeth: A Little  Eating meals: None  Daily Activity Raw Score: 15  Standardized (T-scale) Score: 34.69    Plan  Progress: Progressing Toward Goals  OT Frequency: No Further Treatment    OT Discharge Recommendations  Recommendation: Home with intermittent supervision/assistance  Patient Currently Requires Equipment: Owns what is needed    Therapist: Azucena Kuba, OTR/L  Date: 07/25/2023

## 2023-07-26 ENCOUNTER — Inpatient Hospital Stay: Admit: 2023-07-26 | Discharge: 2023-07-26 | Payer: MEDICARE

## 2023-07-26 LAB — MAGNESIUM
MAGNESIUM: 1.9 mg/dL (ref 1.6–2.6)
MAGNESIUM: 2 mg/dL (ref 1.6–2.6)

## 2023-07-26 LAB — POC GLUCOSE
POC GLUCOSE: 194 mg/dL — ABNORMAL HIGH (ref 70–100)
POC GLUCOSE: 161 mg/dL — ABNORMAL HIGH (ref 70–100)
POC GLUCOSE: 214 mg/dL — ABNORMAL HIGH (ref 70–100)
POC GLUCOSE: 260 mg/dL — ABNORMAL HIGH (ref 70–100)
POC GLUCOSE: 225 mg/dL — ABNORMAL HIGH (ref 70–100)

## 2023-07-26 LAB — ECG 12-LEAD
Q-T INTERVAL: 374 ms
QRS DURATION: 98 ms
QTC CALCULATION (BAZETT): 482 ms
R AXIS: 31 degrees
T AXIS: -34 degrees
VENTRICULAR RATE: 100 {beats}/min

## 2023-07-26 LAB — POTASSIUM
POTASSIUM: 4.3 MMOL/L (ref 3.5–5.1)
POTASSIUM: 4.1 MMOL/L (ref 3.5–5.1)

## 2023-07-26 MED ORDER — SOTALOL 160 MG PO TAB
160 mg | Freq: Two times a day (BID) | ORAL | 0 refills | Status: DC
Start: 2023-07-26 — End: 2023-07-26

## 2023-07-26 MED ORDER — SOTALOL 160 MG PO TAB
160 mg | Freq: Two times a day (BID) | ORAL | 0 refills | Status: DC
Start: 2023-07-26 — End: 2023-07-27
  Administered 2023-07-27: 03:00:00 160 mg via ORAL

## 2023-07-26 MED ORDER — WARFARIN 5 MG PO TAB
5 mg | Freq: Every evening | ORAL | 0 refills | Status: DC
Start: 2023-07-26 — End: 2023-07-29
  Administered 2023-07-27 – 2023-07-29 (×3): 5 mg via ORAL

## 2023-07-26 MED ORDER — SOTALOL 80 MG PO TAB
80 mg | Freq: Two times a day (BID) | ORAL | 0 refills | Status: DC
Start: 2023-07-26 — End: 2023-07-26

## 2023-07-26 MED ORDER — METHYLNALTREXONE 12 MG/0.6 ML SC SOLN
12 mg | Freq: Once | SUBCUTANEOUS | 0 refills | Status: DC
Start: 2023-07-26 — End: 2023-07-26

## 2023-07-26 MED ORDER — HEPARIN, PORCINE (PF) 5,000 UNIT/0.5 ML IJ SYRG
5000 [IU] | SUBCUTANEOUS | 0 refills | Status: DC
Start: 2023-07-26 — End: 2023-07-30
  Administered 2023-07-26 – 2023-07-29 (×10): 5000 [IU] via SUBCUTANEOUS

## 2023-07-26 MED ORDER — INSULIN GLARGINE 100 UNIT/ML (3 ML) SC INJ PEN
18 [IU] | Freq: Every day | SUBCUTANEOUS | 0 refills | Status: DC
Start: 2023-07-26 — End: 2023-07-27
  Administered 2023-07-27: 15:00:00 18 [IU] via SUBCUTANEOUS

## 2023-07-26 NOTE — Progress Notes
CTS Nursing Progress Note  ?  Neuro status is Oriented X4: Yes   Mobility: x3 walks in hallway no pressors (included in this section- dangle, walks, concerns, et Karie Soda)    Any Pacing requirements/episodes: No    Change in vasopressor requirements : decrease    CI > 2: N/A  CVP < 10: Yes    Chest Tube output: NA    Estimated average urine output last 4 hours (ml/hr): 150/hr    Acute Changes in patient status/interventions: None, made tele (included in this section, new lines/drains, procedures, scans, extubation)

## 2023-07-26 NOTE — Progress Notes
Pod4 from cabg/avr. Has not had BM or much flatus. Continue to be in afib, rate controlled. Bps 90-100s/50-60, some lightheadedness w/ ambulation but walked in hallway yesterday. Autodiuesing - net -1.3L for past 24 hrs. Labs w/ hb 8 from 8.4, plt count recovering, cr stable at 0.6. CXR     - aggressive bowel regimen  - spot diurese to get goal -1L   - pull foley catheter  - if continues to be symptomatic w/ his ambulation may consider adding midodrine  - hold on bridge for now, continue coumadin, if not therapeutic in coming days can consider starting for afib  - tele status

## 2023-07-26 NOTE — Progress Notes
Cardiothoracic Surgery Critical Care Progress Note     Darius Cherry  JJOAC'Z Date:  07/26/2023  Admission Date: 07/22/2023  LOS: 4 days    Procedure date: 11/4  Procedure(s):  Coronary artery bypass grafting x3, left internal mammary artery take down, endoscopic vein harvest, Aortic Valve Replacement- tissue, left atrial appendage ligation.  POD#: 4    Active Problems:    Hyperlipemia    Coronary artery disease involving native coronary artery of native heart without angina pectoris    Overweight (BMI 25.0-29.9)    Essential hypertension    Nonrheumatic aortic valve stenosis    Type 2 diabetes mellitus without complication, without long-term current use of insulin (HCC)    Paroxysmal atrial fibrillation (HCC)    S/P CABG x 3    S/P AVR (aortic valve replacement)    Acute blood loss anemia    Platelet dysfunction (HCC)    Thrombocytopenia (HCC)    Vasogenic shock (HCC)    Stress hyperglycemia    SIRS without infection or organ dysfunction (HCC)    Post-operative pain    Anticoagulated on warfarin    Atrial fibrillation status post cardioversion [facility])    Alcohol abuse with withdrawal (HCC)      Assessment/Plan:      Neuro - Pain controlled. Continue PRN oxycodone with scheduled Tylenol, robaxin & Lidoderm patches. Hx of daily ETOH use (6 drinks daily, last on 10/31).  Mild agitation 11/7, with diaphoresis, tachycardia, borderline febrile. Suspect some withdrawal, responded well to 0.25mg  IV ativan. Continue Q6hr PRN for s/s of withdrawal. Has been somewhat somnolent overall since surgery.      CV - SR--> AFib ~ 0600 on 11/6. Attempted DCCV x2 on 11/6, unsuccessful. Remains AF, rate 80-90s overnight, up to 120 transiently with activity this AM. Continue PTA sotalol. Consult EP for recommendations regarding sotalol dosing and overall rhythm management. Per EP plan for TEE/cardioversion on 11/11. BP 90-110s/60-70s (69-84). Cont ASA, statin. No Amio ppx with sotalol. Hold BB due to recent pressor.  Post-procedure TEE LVEF 55-60%.    Resp - SpO2 96% on RA. Daily CXR; reviewed radiology report. Cont IS, aggressive pulm toilet.      Renal - Baseline sCr. 0.9. UOP: 3,165 mL, net -1,075 mL/24hrs. Has not received diuresis post op d/t low filling pressures, continues to auto-diurese. Cont PTA flomax.  Discontinue foley catheter.    GI - ADAT, Last BM: 11/8, continue post op bowel regimen. S/p Relistor 11/6, repeat 11/7.     ID - Afebrile. WBC: 7.7>5.9. Completed peri op abx.     Heme - Hgb 9.8->7.9->8.0. Plt: 135. Monitor thrombocytopenia and acute blood loss anemia.  Start SQ heparin and continue mechanical DVT prophylaxis. Holding PTA Eliquis, started on Warfarin 11/5 (tissue valve), continue QHS. Daily INR. Hold off on Heparin bridge for AFib per CTS, s/p left atrial appendage ligation, ongoing discussion.     FEN - replace Mg and K to minimize the risk of cardiac arrhythmias. A1c 7.4%, Endocrine following- appreciate assistance. Currently on SSI TID aspart, MDCF, & Lantus.    Activity - Pt up to chair today, should ambulate in halls with nursing and cardiac rehab TID.      Disposition: Plan as above. Continue ICU care while pt critically ill with Invasive Hemodynamic Management, Management of Vasogenic Shock, Comprehensive Pain Management, and Complex Fluid & Electrolyte Management.    I have seen, personally fully evaluated, and discussed patient with critical care attending Dr. Judy Pimple and the cardiothoracic surgeon.  The patient is critically ill and at risk for life threatening deterioration. I spent 55 minutes (excluding time spent performing or supervising any procedures) providing and personally directing critical care services.     Prophylaxis Review:  Lines:  Yes; Arterial Line; Indication:  Frequent blood draws and Continuous BP monitoring; Location:  Radial  Central Line; Indication:  Med not deliverable peripherally, Incompatability of meds, and Hemodynamic monitoring; Type:  Internal jugular  Antibiotic Usage:  No  VTE: Pharmacological prophylaxis: Warfarin and Mechanical prophylaxis: Foot pump  Urinary Catheter: Yes; Retain foley due to:  Need for accurate Intake and Output  Temporary Pacing Wires Present?: No Date Removed: 11/7    Burns Spain, APRN-NP   CTS Intensive Care  Pager [phone]  07/26/2023    Subjective:       HPI:   Darius Cherry is a 72 y.o. male whose current medical conditions include CAD s/p PCI to circumflex in 2007 and then PCI to circumflex and LAD in 2013, paroxysmal afib on apixaban, HTN, HLD, DM2, severe aortic stenosis (follows with Dr. Barry Dienes). Referred to [facility] valve clinic. Found to have CAD. He is now s/p CABG/AVR (t) with Dr. Sindy Messing.      11/5: wean norepi, dc PAC, possible tele   11/6: Afib, low BP, +volume and pressor, resume sotalol, relistor, pain control, failed DCCV x2  11/7: likely withdrawal, +PRN ativan, DC wires/CTs, spot diuresis, repeat relistor, no heparin bridge per CTS  11/8: Afib, Consult EP, Dc foley, start SQ heparin    Review of Systems   Constitutional:  Negative for chills and fever.   Eyes:  Negative for blurred vision and double vision.   Respiratory:  Negative for cough and shortness of breath.    Cardiovascular:  Positive for chest pain. Negative for palpitations and leg swelling.        Positive for left sided chest pain related to chest tube    Gastrointestinal:  Negative for abdominal pain, nausea and vomiting.   Neurological:  Negative for dizziness, tremors, sensory change, speech change, focal weakness and headaches.   Psychiatric/Behavioral:  The patient is not nervous/anxious.           Objective:        Medications:  Scheduled Meds:acetaminophen (TYLENOL EXTRA STRENGTH) tablet 1,000 mg, 1,000 mg, Oral, Q6H while awake  aspirin chewable tablet 81 mg, 81 mg, Oral, QDAY  atorvastatin (LIPITOR) tablet 80 mg, 80 mg, Oral, QHS  insulin aspart (U-100) (NOVOLOG FLEXPEN U-100 INSULIN) injection PEN 0-12 Units, 0-12 Units, Subcutaneous, 5 X Daily  insulin aspart (U-100) (NOVOLOG FLEXPEN U-100 INSULIN) injection PEN 6 Units, 6 Units, Subcutaneous, TID w/ meals  insulin glargine (LANTUS SOLOSTAR U-100 INSULIN) injection PEN 20 Units, 20 Units, Subcutaneous, QDAY  lidocaine (LIDODERM) 5 % topical patch 1 patch, 1 patch, Topical, QDAY  methocarbamoL (ROBAXIN) tablet 750 mg, 750 mg, Oral, Q8H  polyethylene glycol 3350 (MIRALAX) packet 17 g, 1 packet, Oral, BID  sennosides-docusate sodium (SENOKOT-S) tablet 2 tablet, 2 tablet, Oral, BID  sotaloL (BETAPACE AF) tablet 80 mg, 80 mg, Oral, Q12H*  tamsulosin (FLOMAX) capsule 0.4 mg, 0.4 mg, Oral, QDAY after breakfast  warfarin (COUMADIN) tablet 3 mg, 3 mg, Oral, QHS    Continuous Infusions:      PRN and Respiratory Meds:[START ON 07/27/2023] acetaminophen Q6H PRN **OR** [START ON 07/27/2023] acetaminophen Q6H PRN, alum-mag hydroxide-simeth Q4H PRN, bisacodyL QDAY PRN, dextrose 50% (D50) IV PRN, hydrALAZINE Q6H PRN, LORazepam  (ATIVAN)  injection Q6H PRN, magnesium  sulfate PRN **OR** magnesium oxide PRN, melatonin QHS PRN, milk of magnesium QDAY PRN, ondansetron Q6H PRN **OR** ondansetron (ZOFRAN) IV Q6H PRN, oxyCODONE Q4H PRN, potassium chloride PRN **OR** potassium chloride PRN **OR** potassium chloride in water PRN                       Vital Signs: Last Filed                  Vital Signs: 24 Hour Range   BP: 92/57 (11/08 0500)  Temp: 37 ?C (98.6 ?F) (11/08 0400)  Pulse: 104 (11/08 0500)  Respirations: 19 PER MINUTE (11/08 0500)  SpO2: 96 % (11/08 0500)  O2 Device: None (Room air) (11/08 0400)  O2 Liter Flow: 1 Lpm (11/07 0930) BP: (92-116)/(57-73)   ABP: (100-155)/(46-64)   Temp:  [36.7 ?C (98 ?F)-37.8 ?C (100 ?F)]   Pulse:  [76-120]   Respirations:  [17 PER MINUTE-30 PER MINUTE]   SpO2:  [92 %-99 %]   O2 Device: None (Room air)  O2 Liter Flow: 1 Lpm   Intensity Pain Scale (Self Report): 4 (07/25/23 0900) Vitals:    07/24/23 0500 07/25/23 0500 07/26/23 0500   Weight: 95.7 kg (210 lb 15.7 oz) 94.7 kg (208 lb 12.4 oz) 95.3 kg (210 lb)           Intake/Output Summary:  (Last 24 hours)    Intake/Output Summary (Last 24 hours) at 07/26/2023 0608  Last data filed at 07/26/2023 0500  Gross per 24 hour   Intake 2228 ml   Output 3230 ml   Net -1002 ml         Physical Exam:         Neuro: A&O x4  Cardiovascular: irreg   Respiratory: LS CTA bilaterally - diminished bases  GI: soft, NT, hypoactive BS  Extremities: No Edema  Incisions: Sternal incision dressing, dry and intact. No crepitus or sternal instability.        Pertinent Meds:               Taking           Reason for Not Taking  1. Aspirin Yes  N/a   2. B-Blocker Yes - Sotalol    3. Statin Yes  N/a   4. ACE/ARB No No: No Indication (LVEF >40%)       Artificial airway:  None                                                                                         Vent weaning trial:  Not applicable    LABS:  Recent Labs     07/24/23  0354 07/25/23  0257 07/25/23  1446 07/25/23  2209 07/26/23  0400   NA 135* 133* 135*  --  136*   K 4.2 4.3 4.2 4.3 4.2   CL 102 100 102  --  101   CO2 26 28 26   --  28   GAP 7 5 7   --  7   BUN 25 31* 24  --  20   CR 0.62 0.67 0.62  --  0.61   GLU 129* 162* 143*  --  145*   CA 8.3* 8.1* 8.3*  --  8.3*   MG 2.1 2.0  --  1.9 2.1       Recent Labs     07/24/23  0354 07/25/23  0257 07/25/23  1446 07/26/23  0400   WBC 11.8* 7.7 7.4 5.9   HGB 9.8* 7.9* 8.4* 8.0*   HCT 28.5* 23.0* 25.2* 23.6*   PLTCT 128* 97* 124* 135*   PT 12.5 12.2  --  12.3   INR 1.1 1.1  --  1.1      Estimated Creatinine Clearance: 109.1 mL/min (based on SCr of 0.61 mg/dL).  Vitals:    07/24/23 0500 07/25/23 0500 07/26/23 0500   Weight: 95.7 kg (210 lb 15.7 oz) 94.7 kg (208 lb 12.4 oz) 95.3 kg (210 lb)      Recent Labs     07/24/23  0932   PHART 7.43   PO2ART 101*           Radiology and Other Diagnostic Procedures Review:    Reviewed

## 2023-07-26 NOTE — Progress Notes
Endocrinology Progress Note    Today's Date:  07/25/2023  Admission Date: 07/22/2023    Reason for consult: DM managment  Type of Consult: Co-Management w/Signed Orders    Assessment:     Darius Cherry is a 72 yr old male with history of type 2 DM and CAD, hypertension, paroxysmal atrial fibrillation who underwent CABG x 3 and aortic valve replacement on 07/22/2023.      Type 2 Diabetes Mellitus, uncontrolled  Steroid-induced hyperglycemia     Diabetes History:  Dx: > 10 years ago  Outpatient Regimen:  Rybelsus 14mg  tablet daily, metformin XR 500mg  bid, Jardiance 25mg  daily  Outpatient provider:  PCP, Dr. Wilford Grist  A1c:  7.4% (06/10/2023)  prior A1c 9.5% in 04/2022     Coronary artery disease  Hypertension  Dyslipidemia     Glucose, POC   Date/Time Value Ref Range Status   07/25/2023 1633 135 (H) 70 - 100 MG/DL Final   95/62/1308 6578 207 (H) 70 - 100 MG/DL Final   46/96/2952 8413 184 (H) 70 - 100 MG/DL Final   24/40/1027 2536 172 (H) 70 - 100 MG/DL Final   64/40/3474 2595 221 (H) 70 - 100 MG/DL Final   63/87/5643 3295 192 (H) 70 - 100 MG/DL Final   18/84/1660 6301 252 (H) 70 - 100 MG/DL Final   60/06/9322 5573 131 (H) 70 - 100 MG/DL Final      Diet: carb consistent    Recommendations:     BG trends reviewed.    BG at goal and well-controlled on insulin drip with low hourly rates.  Will transition off insulin drip     continue Lantus 20 units daily   Novolog 6 units tid with meals, hold if NPO or not eating  Reduce correction factor to mid-dose Novolog CF 5 x daily  Hold oral medications  Anticipate resumption of metformin soon if no further surgeries or contrast imaging planned     Discharge planning:  - final regimen TBD  - he may or may not need new insulin at discharge; hopefully he can resume home medications  - would benefit from discussion on transitioning from Rybelsus to injectable GLP-1 - will discuss with him  - plan to have him f/u locally with PCP      Thank you for this consult, we will follow. Please page me directly during 8am-5pm for questions, or the on-call endocrine fellow after 5pm.     Estill Bakes DO  Assistant Professor  Endocrinology, Diabetes & Metabolism  Pager: [phone] or available on Voalte     History of Present Illness    Darius Cherry is a 72 y.o. year old male.    Resting in bedside chair, no acute complaints, reports good appetite, glucose trends reviewed.     Estimated Creatinine Clearance: 108.8 mL/min (based on SCr of 0.62 mg/dL).    Allergies     Allergies   Allergen Reactions    Celebrex [Celecoxib] RASH and ITCHING       Medications   Scheduled Meds:acetaminophen (TYLENOL EXTRA STRENGTH) tablet 1,000 mg, 1,000 mg, Oral, Q6H while awake  aspirin chewable tablet 81 mg, 81 mg, Oral, QDAY  atorvastatin (LIPITOR) tablet 80 mg, 80 mg, Oral, QHS  insulin aspart (U-100) (NOVOLOG FLEXPEN U-100 INSULIN) injection PEN 0-12 Units, 0-12 Units, Subcutaneous, 5 X Daily  insulin aspart (U-100) (NOVOLOG FLEXPEN U-100 INSULIN) injection PEN 6 Units, 6 Units, Subcutaneous, TID w/ meals  insulin glargine (LANTUS SOLOSTAR U-100 INSULIN) injection PEN 20  Units, 20 Units, Subcutaneous, QDAY  lidocaine (LIDODERM) 5 % topical patch 1 patch, 1 patch, Topical, QDAY  methocarbamoL (ROBAXIN) tablet 750 mg, 750 mg, Oral, Q8H  polyethylene glycol 3350 (MIRALAX) packet 17 g, 1 packet, Oral, BID  sennosides-docusate sodium (SENOKOT-S) tablet 2 tablet, 2 tablet, Oral, BID  sotaloL (BETAPACE AF) tablet 80 mg, 80 mg, Oral, Q12H*  tamsulosin (FLOMAX) capsule 0.4 mg, 0.4 mg, Oral, QDAY after breakfast  warfarin (COUMADIN) tablet 3 mg, 3 mg, Oral, QHS    Continuous Infusions:      PRN and Respiratory Meds:[START ON 07/27/2023] acetaminophen Q6H PRN **OR** [START ON 07/27/2023] acetaminophen Q6H PRN, alum-mag hydroxide-simeth Q4H PRN, bisacodyL QDAY PRN, dextrose 50% (D50) IV PRN, hydrALAZINE Q6H PRN, LORazepam  (ATIVAN)  injection Q6H PRN, magnesium sulfate PRN **OR** magnesium oxide PRN, milk of magnesium QDAY PRN, ondansetron Q6H PRN **OR** ondansetron (ZOFRAN) IV Q6H PRN, oxyCODONE Q4H PRN, potassium chloride PRN **OR** potassium chloride PRN **OR** potassium chloride in water PRN      Physical Examination                          Vital Signs: Last                  Vital Signs: 24 Hour Range   BP: 104/59 (11/07 1800)  Temp: 37.4 ?C (99.4 ?F) (11/07 1600)  Pulse: 94 (11/07 1800)  Respirations: 29 PER MINUTE (11/07 1800)  SpO2: 93 % (11/07 1800)  O2 Device: None (Room air) (11/07 1600)  O2 Liter Flow: 1 Lpm (11/07 0930) BP: (96-114)/(59-68)   ABP: (100-155)/(46-65)   Temp:  [37 ?C (98.6 ?F)-37.8 ?C (100 ?F)]   Pulse:  [76-120]   Respirations:  [18 PER MINUTE-30 PER MINUTE]   SpO2:  [92 %-100 %]   O2 Device: None (Room air)  O2 Liter Flow: 1 Lpm     GEN: Alert, cooperative, no acute distress.  HEAD: Normocephalic, atraumatic  PULM: No respiratory distress.  ABD: Non-distended.  SKIN: No rashes noted.  EXT: No edema, no cyanosis.     Lab Review     Point of Care Testing  (Last 24 hours)  Glucose: (!) 143 (07/25/23 1446)  POC Glucose (Download): (!) 135 (07/25/23 1633)    Recent Labs     07/23/23  0252 07/24/23  0354 07/25/23  0257 07/25/23  1446   NA 139 135* 133* 135*   K 4.0 4.2 4.3 4.2   CL 109 102 100 102   CO2 23 26 28 26    GAP 7 7 5 7    BUN 23 25 31* 24   CR 0.73 0.62 0.67 0.62   GLU 130* 129* 162* 143*   CA 7.3* 8.3* 8.1* 8.3*   MG 2.5 2.1 2.0  --        Recent Labs     07/23/23  0252 07/24/23  0354 07/25/23  0257 07/25/23  1446   WBC 10.6 11.8* 7.7 7.4   HGB 10.7* 9.8* 7.9* 8.4*   HCT 32.0* 28.5* 23.0* 25.2*   PLTCT 155 128* 97* 124*   PT  --  12.5 12.2  --    INR  --  1.1 1.1  --       Estimated Creatinine Clearance: 108.8 mL/min (based on SCr of 0.62 mg/dL).  Vitals:    07/23/23 0455 07/24/23 0500 07/25/23 0500   Weight: 98 kg (216 lb 0.8 oz) 95.7 kg (210 lb 15.7 oz) 94.7  kg (208 lb 12.4 oz)        Thyroid Studies    Lab Results   Component Value Date/Time    TSH 3.17 12/11/2021 12:00 AM    No results found for: Edsel Petrin, [facility]     Pertinent radiology images reviewed.    Latrelle Dodrill, DO   Endocrine Faculty  07/25/2023

## 2023-07-26 NOTE — Consults
Electrophysiology Consult Note:    Admission Date: 07/22/2023  Date of Consultation:  07/26/2023  LOS: 4 days  Requesting Physician: Arby Barrette, MD  Consulting Physician:  Dr. Milas Kocher  Code Status: Full Code    Reason for Consultation  Opinion and recommendations regarding EP consult. Patient on sotolol PTA, s/p CABG x3 and tissue AVR. Ongoing a-fib s/p DCCV x2. Recommendations for arrhythmia management    Assessment:    #Atrial Fibrillation s/p DCCV, CHADS-VASc 3  #S/p LAA Ligation on 11/6  Previously diagnosed with atrial fibrillation in 2023.  At that time, underwent cardioversion and has since been maintained on sotalol and Eliquis.  Currently admitted after undergoing CABG/AVR, with 2 unsuccessful cardioversions on 11/6.  TTE notable for severely dilated LA, patient asymptomatic.    Recommendations:  - Currently, on sotalol 80 mg twice daily, sub-therapeutic on warfarin  - Would recommend increasing sotalol to 160 mg BID  - Plan for TEE/cardioversion on Monday  - Will place orders for the above  - If the above is unsuccessful, would consider initiation of amiodarone and OP EP follow-up (for consideration of ablation)    Patient discussed with Dr. Milas Kocher.  Please see attestation for any additional recommendations.    Alwyn Ren, MD  Cardiology Fellow    ___________________________________________________________    History of Present Illness:  Darius Cherry is a 72 y.o. male patient with PMH including paroxysmal A-fib, hypertension, type 2 diabetes, severe multivessel CAD who underwent CABG x 3, aortic valve replacement, and left atrial appendage ligation on 07/22/2023.    Underwent unsuccessful synchronized cardioversion x 2 on 11/6.  Currently, denies any cardiac symptoms.  Previously underwent cardioversion in April 2023.    Past Medical History:  Past Medical History:   Diagnosis Date    Aortic valve stenosis     Atrial fibrillation (HCC)     CAD (coronary artery disease) 04/26/2009    DM (diabetes mellitus) (HCC)     Fracture 2016    Left ankle    History of coronary artery stent placement 2007/ 2013    x4    Hyperlipemia 04/26/2009    Hypertension 04/26/2009    Murmur, cardiac     Myocardial infarction [facility]) 2007    x2 stents    Skin cancer     lips, head         Social History:  Social History     Socioeconomic History    Marital status: Married   Tobacco Use    Smoking status: Former     Current packs/day: 0.00     Average packs/day: 2.0 packs/day for 5.0 years (10.0 ttl pk-yrs)     Types: Cigarettes     Start date: 02/15/2001     Quit date: 02/15/2006     Years since quitting: 17.4    Smokeless tobacco: Never   Vaping Use    Vaping status: Never Used   Substance and Sexual Activity    Alcohol use: Yes     Alcohol/week: 2.0 standard drinks of alcohol     Types: 2 Cans of beer per week     Comment: social drinking    Drug use: No       Surgical History:  Surgical History:   Procedure Laterality Date    ARTHROPLASTY REPLACEMENT TOTAL ANKLE (PROPHECY CASE [phone]) Left 11/05/2016    Performed by Ree Shay, MD at [facility] OR    LENGTHENING LEFT  ACHILLES TENDON Left 11/05/2016    Performed by  Ree Shay, MD at [facility] OR    ARTHROPLASTY REVISION TOTAL ANKLE TO IN-BONE (PROPHECY [phone]) Left 03/28/2018    Performed by Ree Shay, MD at [facility] OR    REMOVAL HARDWARE - DEEP - LOWER EXTREMITY  03/28/2018    Performed by Ree Shay, MD at [facility] OR    ANGIOGRAPHY CORONARY ARTERY WITH LEFT HEART CATHETERIZATION N/A 06/10/2023    Performed by Laney Pastor, MD at [facility] CATH LAB    PERCUTANEOUS CORONARY STENT PLACEMENT WITH ANGIOPLASTY N/A 06/10/2023    Performed by Laney Pastor, MD at [facility] CATH LAB    Coronary artery bypass grafting x3, left internal mammary artery take down, endoscopic vein harvest, Aortic Valve Replacement- tissue, left atrial appendage ligation. N/A 07/22/2023    Performed by Arby Barrette, MD at [facility] CVOR    CORONARY ANGIOPLASTY      HAND SURGERY Left     HX CORONARY STENT PLACEMENT      HX HEART CATHETERIZATION  2007, 05/2012    x4 stents total    SKIN CANCER EXCISION      left ear flap       Family History:  Family History   Problem Relation Name Age of Onset    Diabetes Mother Candi Leash     Stroke Mother Ruthanna     Coronary Artery Disease Father Konrad Dolores     Diabetes Father Konrad Dolores        Medications:  acetaminophen (TYLENOL EXTRA STRENGTH) tablet 1,000 mg, 1,000 mg, Oral, Q6H while awake  aspirin chewable tablet 81 mg, 81 mg, Oral, QDAY  atorvastatin (LIPITOR) tablet 80 mg, 80 mg, Oral, QHS  heparin (porcine) PF syringe 5,000 Units, 5,000 Units, Subcutaneous, Q8H  insulin aspart (U-100) (NOVOLOG FLEXPEN U-100 INSULIN) injection PEN 0-12 Units, 0-12 Units, Subcutaneous, 5 X Daily  insulin aspart (U-100) (NOVOLOG FLEXPEN U-100 INSULIN) injection PEN 6 Units, 6 Units, Subcutaneous, TID w/ meals  insulin glargine (LANTUS SOLOSTAR U-100 INSULIN) injection PEN 18 Units, 18 Units, Subcutaneous, QDAY  lidocaine (LIDODERM) 5 % topical patch 1 patch, 1 patch, Topical, QDAY  methocarbamoL (ROBAXIN) tablet 750 mg, 750 mg, Oral, Q8H  polyethylene glycol 3350 (MIRALAX) packet 17 g, 1 packet, Oral, BID  sennosides-docusate sodium (SENOKOT-S) tablet 2 tablet, 2 tablet, Oral, BID  sotaloL (BETAPACE AF) tablet 80 mg, 80 mg, Oral, Q12H*  tamsulosin (FLOMAX) capsule 0.4 mg, 0.4 mg, Oral, QDAY after breakfast  warfarin (COUMADIN) tablet 5 mg, 5 mg, Oral, QHS      [START ON 07/27/2023] acetaminophen Q6H PRN **OR** [START ON 07/27/2023] acetaminophen Q6H PRN, alum-mag hydroxide-simeth Q4H PRN, bisacodyL QDAY PRN, dextrose 50% (D50) IV PRN, hydrALAZINE Q6H PRN, LORazepam  (ATIVAN)  injection Q6H PRN, magnesium sulfate PRN **OR** magnesium oxide PRN, melatonin QHS PRN, milk of magnesium QDAY PRN, ondansetron Q6H PRN **OR** ondansetron (ZOFRAN) IV Q6H PRN, oxyCODONE Q4H PRN, potassium chloride PRN **OR** potassium chloride PRN **OR** potassium chloride in water PRN    Allergies:  Allergies   Allergen Reactions    Celebrex [Celecoxib] RASH and ITCHING       Review of Systems:  Negative unless outlined above                           Vital Signs: Most Recent                 Vital Signs: 24 Hour Range   BP: 126/78 (11/08 0800)  Temp: 36.6 ?C (97.9 ?F) (  11/08 0800)  Pulse: 115 (11/08 0848)  Respirations: 17 PER MINUTE (11/08 0848)  SpO2: 100 % (11/08 0848)  O2 Device: None (Room air) (11/08 0848) BP: (92-126)/(57-78)   ABP: (100-125)/(47-57)   Temp:  [36.6 ?C (97.9 ?F)-37.4 ?C (99.4 ?F)]   Pulse:  [76-123]   Respirations:  [17 PER MINUTE-29 PER MINUTE]   SpO2:  [92 %-100 %]   O2 Device: None (Room air)     Vitals:    07/24/23 0500 07/25/23 0500 07/26/23 0500   Weight: 95.7 kg (210 lb 15.7 oz) 94.7 kg (208 lb 12.4 oz) 95.3 kg (210 lb)       Intake/Output Summary (Last 24 hours) at 07/26/2023 0954  Last data filed at 07/26/2023 0800  Gross per 24 hour   Intake 2110 ml   Output 2865 ml   Net -755 ml           Physical Exam:  GEN: well appearing 72 y.o. male who appears stated age and is no acute distress  HEENT: Unremarkable  LUNGS: CTA bil.  HEART: Normal rate, irregularly irregular rhythm  ABD: soft, NT, +BS  EXTR: no LE edema  SKIN: warm and dry  NEUR: A&Ox3  PSYCH: calm and cooperative    Labs:  24-hour labs:    Results for orders placed or performed during the hospital encounter of 07/22/23 (from the past 24 hour(s))   POC GLUCOSE    Collection Time: 07/25/23 12:17 PM   Result Value Ref Range    Glucose, POC 207 (H) 70 - 100 MG/DL   CBC    Collection Time: 07/25/23  2:46 PM   Result Value Ref Range    White Blood Cells 7.4 4.5 - 11.0 K/UL    RBC 2.66 (L) 4.4 - 5.5 M/UL    Hemoglobin 8.4 (L) 13.5 - 16.5 GM/DL    Hematocrit 40.9 (L) 40 - 50 %    MCV 94.8 80 - 100 FL    MCH 31.7 26 - 34 PG    MCHC 33.4 32.0 - 36.0 G/DL    RDW 81.1 11 - 15 %    Platelet Count 124 (L) 150 - 400 K/UL    MPV 9.8 7 - 11 FL   BASIC METABOLIC PANEL    Collection Time: 07/25/23  2:46 PM   Result Value Ref Range    Sodium 135 (L) 137 - 147 MMOL/L    Potassium 4.2 3.5 - 5.1 MMOL/L    Chloride 102 98 - 110 MMOL/L    CO2 26 21 - 30 MMOL/L    Anion Gap 7 3 - 12    Glucose 143 (H) 70 - 100 MG/DL    Blood Urea Nitrogen 24 7 - 25 MG/DL    Creatinine 9.14 0.4 - 1.24 MG/DL    Calcium 8.3 (L) 8.5 - 10.6 MG/DL    eGFR >78 >29 mL/min   POC GLUCOSE    Collection Time: 07/25/23  4:33 PM   Result Value Ref Range    Glucose, POC 135 (H) 70 - 100 MG/DL   POC GLUCOSE    Collection Time: 07/25/23  9:14 PM   Result Value Ref Range    Glucose, POC 194 (H) 70 - 100 MG/DL   POTASSIUM    Collection Time: 07/25/23 10:09 PM   Result Value Ref Range    Potassium 4.3 3.5 - 5.1 MMOL/L   MAGNESIUM    Collection Time: 07/25/23 10:09 PM   Result Value Ref Range  Magnesium 1.9 1.6 - 2.6 mg/dL   TYPE & CROSSMATCH    Collection Time: 07/26/23  4:00 AM   Result Value Ref Range    Units Ordered 0     Crossmatch Expires 07/29/2023,2359     Record Check FOUND     ABO/RH(D) O POS     Antibody Screen NEG     Electronic Crossmatch YES    MAGNESIUM    Collection Time: 07/26/23  4:00 AM   Result Value Ref Range    Magnesium 2.1 1.6 - 2.6 mg/dL   CBC    Collection Time: 07/26/23  4:00 AM   Result Value Ref Range    White Blood Cells 5.9 4.5 - 11.0 K/UL    RBC 2.52 (L) 4.4 - 5.5 M/UL    Hemoglobin 8.0 (L) 13.5 - 16.5 GM/DL    Hematocrit 03.4 (L) 40 - 50 %    MCV 93.7 80 - 100 FL    MCH 31.6 26 - 34 PG    MCHC 33.8 32.0 - 36.0 G/DL    RDW 74.2 11 - 15 %    Platelet Count 135 (L) 150 - 400 K/UL    MPV 9.8 7 - 11 FL   BASIC METABOLIC PANEL    Collection Time: 07/26/23  4:00 AM   Result Value Ref Range    Sodium 136 (L) 137 - 147 MMOL/L    Potassium 4.2 3.5 - 5.1 MMOL/L    Chloride 101 98 - 110 MMOL/L    CO2 28 21 - 30 MMOL/L    Anion Gap 7 3 - 12    Glucose 145 (H) 70 - 100 MG/DL    Blood Urea Nitrogen 20 7 - 25 MG/DL    Creatinine 5.95 0.4 - 1.24 MG/DL    Calcium 8.3 (L) 8.5 - 10.6 MG/DL    eGFR >63 >87 mL/min   PROTIME INR (PT)    Collection Time: 07/26/23  4:00 AM   Result Value Ref Range    Protime 12.3 10.2 - 12.9 SEC    INR 1.1 0.9 - 1.2   POC GLUCOSE    Collection Time: 07/26/23  4:05 AM   Result Value Ref Range    Glucose, POC 161 (H) 70 - 100 MG/DL   POC GLUCOSE    Collection Time: 07/26/23  6:18 AM   Result Value Ref Range    Glucose, POC 214 (H) 70 - 100 MG/DL

## 2023-07-26 NOTE — Progress Notes
RT Adult Assessment Note    NAME:Chapin Caitlin Symes             MRN: [id number]             DOB:Nov 11, 1950          AGE: 72 y.o.  ADMISSION DATE: 07/22/2023             DAYS ADMITTED: LOS: 4 days    Additional Comments:  Impressions of the patient: Pt alert, NAD. Performing 1250 on IS with proper technique.   Intervention(s)/outcome(s): Assessment.  Patient education that was completed: Importance of continuing IS.  Recommendations to the care team: Encourage IS.    Vital Signs:  Pulse: 93  RR: 18  SpO2: 95 %  O2 Device:    Liter Flow:    O2%:      Breath Sounds:   Right Apex Breath Sounds: Clear (Implies normal)  Right Base Breath Sounds: Decreased  Left Apex Breath Sounds: Clear (Implies normal)  Left Base Breath Sounds: Decreased  Respiratory Effort:    Unlabored  Comments:

## 2023-07-26 NOTE — Progress Notes
Endocrinology Progress Note    Today's Date:  07/26/2023  Admission Date: 07/22/2023    Reason for consult: DM managment  Type of Consult: Co-Management w/Signed Orders    Assessment:     Darius Cherry is a 72 yr old male with history of type 2 DM and CAD, hypertension, paroxysmal atrial fibrillation who underwent CABG x 3 and aortic valve replacement on 07/22/2023.      Type 2 Diabetes Mellitus, uncontrolled  Steroid-induced hyperglycemia     Diabetes History:  Dx: > 10 years ago  Outpatient Regimen:  Rybelsus 14mg  tablet daily, metformin XR 500mg  bid, Jardiance 25mg  daily  Outpatient provider:  PCP, Dr. Wilford Grist  A1c:  7.4% (06/10/2023)  prior A1c 9.5% in 04/2022     Coronary artery disease  Hypertension  Dyslipidemia     Glucose, POC   Date/Time Value Ref Range Status   07/26/2023 1103 260 (H) 70 - 100 MG/DL Final   18/84/1660 6301 214 (H) 70 - 100 MG/DL Final   60/06/9322 5573 161 (H) 70 - 100 MG/DL Final   22/10/5425 0623 194 (H) 70 - 100 MG/DL Final   76/28/3151 7616 135 (H) 70 - 100 MG/DL Final   07/37/1062 6948 207 (H) 70 - 100 MG/DL Final   54/62/7035 0093 184 (H) 70 - 100 MG/DL Final   81/82/9937 1696 172 (H) 70 - 100 MG/DL Final      Diet: carb consistent    Recommendations:     BG trends reviewed.    BG slightly higher pre-lunch, I wonder if this was due to a mis-match with breakfast intake and breakfast insulin given later.      continue Lantus 20 units daily   Novolog 6 units tid with meals, hold if NPO or not eating  Mid-dose correction factor 5 x daily  Hold oral medications  Anticipate resumption of metformin soon if no further surgeries or contrast imaging planned  Hold outpatient rybelsus and jardiance - not appropriate to resume at this time      Discharge planning:  - final regimen TBD  - he may or may not need new insulin at discharge; hopefully he can resume home medications  - would benefit from discussion on transitioning from Rybelsus to injectable GLP-1 - will discuss with him  - plan to have him f/u locally with PCP    Thank you for this consult, we will follow.  Please page me directly during 8am-5pm for questions, or the on-call endocrine fellow after 5pm.     Estill Bakes DO  Assistant Professor  Endocrinology, Diabetes & Metabolism  Pager: [phone] or available on Voalte     History of Present Illness    Darius Cherry is a 72 y.o. year old male.    Resting in bed, no acute concerns, BG trends reviewed.     Estimated Creatinine Clearance: 109.1 mL/min (based on SCr of 0.61 mg/dL).    Allergies     Allergies   Allergen Reactions    Celebrex [Celecoxib] RASH and ITCHING       Medications   Scheduled Meds:acetaminophen (TYLENOL EXTRA STRENGTH) tablet 1,000 mg, 1,000 mg, Oral, Q6H while awake  aspirin chewable tablet 81 mg, 81 mg, Oral, QDAY  atorvastatin (LIPITOR) tablet 80 mg, 80 mg, Oral, QHS  heparin (porcine) PF syringe 5,000 Units, 5,000 Units, Subcutaneous, Q8H  insulin aspart (U-100) (NOVOLOG FLEXPEN U-100 INSULIN) injection PEN 0-12 Units, 0-12 Units, Subcutaneous, 5 X Daily  insulin aspart (U-100) (NOVOLOG FLEXPEN U-100  INSULIN) injection PEN 6 Units, 6 Units, Subcutaneous, TID w/ meals  insulin glargine (LANTUS SOLOSTAR U-100 INSULIN) injection PEN 18 Units, 18 Units, Subcutaneous, QDAY  lidocaine (LIDODERM) 5 % topical patch 1 patch, 1 patch, Topical, QDAY  methocarbamoL (ROBAXIN) tablet 750 mg, 750 mg, Oral, Q8H  polyethylene glycol 3350 (MIRALAX) packet 17 g, 1 packet, Oral, BID  sennosides-docusate sodium (SENOKOT-S) tablet 2 tablet, 2 tablet, Oral, BID  sotaloL (BETAPACE) tablet 160 mg, 160 mg, Oral, Q12H*  tamsulosin (FLOMAX) capsule 0.4 mg, 0.4 mg, Oral, QDAY after breakfast  warfarin (COUMADIN) tablet 5 mg, 5 mg, Oral, QHS    Continuous Infusions:      PRN and Respiratory Meds:[START ON 07/27/2023] acetaminophen Q6H PRN **OR** [START ON 07/27/2023] acetaminophen Q6H PRN, alum-mag hydroxide-simeth Q4H PRN, bisacodyL QDAY PRN, dextrose 50% (D50) IV PRN, hydrALAZINE Q6H PRN, LORazepam  (ATIVAN)  injection Q6H PRN, magnesium sulfate PRN **OR** magnesium oxide PRN, melatonin QHS PRN, milk of magnesium QDAY PRN, ondansetron Q6H PRN **OR** ondansetron (ZOFRAN) IV Q6H PRN, oxyCODONE Q4H PRN, potassium chloride PRN **OR** potassium chloride PRN **OR** potassium chloride in water PRN      Physical Examination                          Vital Signs: Last                  Vital Signs: 24 Hour Range   BP: 113/76 (11/08 1335)  Temp: 37.1 ?C (98.7 ?F) (11/08 1335)  Pulse: 103 (11/08 1335)  Respirations: 29 PER MINUTE (11/08 1335)  SpO2: 94 % (11/08 1335)  O2 Device: None (Room air) (11/08 1335) BP: (92-126)/(57-78)   ABP: (100-123)/(47-52)   Temp:  [36.6 ?C (97.9 ?F)-37.4 ?C (99.4 ?F)]   Pulse:  [78-123]   Respirations:  [17 PER MINUTE-29 PER MINUTE]   SpO2:  [92 %-100 %]   O2 Device: None (Room air)     GEN: Alert, cooperative, no acute distress.  HEAD: Normocephalic, atraumatic  PULM: No respiratory distress.  ABD: Non-distended.  SKIN: No rashes noted.  EXT: No edema, no cyanosis.     Lab Review     Point of Care Testing  (Last 24 hours)  Glucose: (!) 145 (07/26/23 0400)  POC Glucose (Download): (!) 260 (07/26/23 1103)    Recent Labs     07/24/23  0354 07/25/23  0257 07/25/23  1446 07/25/23  2209 07/26/23  0400   NA 135* 133* 135*  --  136*   K 4.2 4.3 4.2 4.3 4.2   CL 102 100 102  --  101   CO2 26 28 26   --  28   GAP 7 5 7   --  7   BUN 25 31* 24  --  20   CR 0.62 0.67 0.62  --  0.61   GLU 129* 162* 143*  --  145*   CA 8.3* 8.1* 8.3*  --  8.3*   MG 2.1 2.0  --  1.9 2.1       Recent Labs     07/24/23  0354 07/25/23  0257 07/25/23  1446 07/26/23  0400   WBC 11.8* 7.7 7.4 5.9   HGB 9.8* 7.9* 8.4* 8.0*   HCT 28.5* 23.0* 25.2* 23.6*   PLTCT 128* 97* 124* 135*   PT 12.5 12.2  --  12.3   INR 1.1 1.1  --  1.1      Estimated Creatinine Clearance: 109.1  mL/min (based on SCr of 0.61 mg/dL).  Vitals:    07/24/23 0500 07/25/23 0500 07/26/23 0500   Weight: 95.7 kg (210 lb 15.7 oz) 94.7 kg (208 lb 12.4 oz) 95.3 kg (210 lb)        Thyroid Studies    Lab Results   Component Value Date/Time    TSH 3.17 12/11/2021 12:00 AM    No results found for: Edsel Petrin, [facility]     Pertinent radiology images reviewed.    Latrelle Dodrill, DO   Endocrine Faculty  07/26/2023

## 2023-07-27 ENCOUNTER — Encounter: Admit: 2023-07-27 | Discharge: 2023-07-27 | Payer: MEDICARE

## 2023-07-27 ENCOUNTER — Inpatient Hospital Stay: Admit: 2023-07-27 | Discharge: 2023-07-27 | Payer: MEDICARE

## 2023-07-27 LAB — POC GLUCOSE
POC GLUCOSE: 194 mg/dL — ABNORMAL HIGH (ref 70–100)
POC GLUCOSE: 182 mg/dL — ABNORMAL HIGH (ref 70–100)
POC GLUCOSE: 180 mg/dL — ABNORMAL HIGH (ref 70–100)
POC GLUCOSE: 256 mg/dL — ABNORMAL HIGH (ref 70–100)
POC GLUCOSE: 138 mg/dL — ABNORMAL HIGH (ref 70–100)

## 2023-07-27 LAB — ECG 12-LEAD: VENTRICULAR RATE: 98 {beats}/min

## 2023-07-27 MED ORDER — SOTALOL 80 MG PO TAB
160 mg | Freq: Two times a day (BID) | ORAL | 0 refills | Status: DC
Start: 2023-07-27 — End: 2023-07-30
  Administered 2023-07-27 – 2023-07-29 (×5): 160 mg via ORAL

## 2023-07-27 MED ORDER — INSULIN GLARGINE 100 UNIT/ML (3 ML) SC INJ PEN
12 [IU] | Freq: Every day | SUBCUTANEOUS | 0 refills | Status: DC
Start: 2023-07-27 — End: 2023-07-30

## 2023-07-27 MED ORDER — METFORMIN 500 MG PO TAB
500 mg | Freq: Two times a day (BID) | ORAL | 0 refills | Status: DC
Start: 2023-07-27 — End: 2023-07-30
  Administered 2023-07-27 – 2023-07-29 (×5): 500 mg via ORAL

## 2023-07-27 NOTE — Progress Notes
07/27/23 1150   Cardiac Rehab Activity   Distance Walked (feet) 360 ft   Ambulation Goal ft   Patient Position Sitting   BP Pre-activity 99/59   BP Post-activity 103/61   HR Pre-activity 72 bpm   HR Post-activity 71   SaO2 Pre-activity 96 %   SaO2 Post-activity 93   O2 Device None (Room Air)   Comments Patient walked full lap around the unit with walker, tolerated well. Patient presented with steady gait and voiced no complaints. Patient requested to go back to bed upon return to room.   Mobility   Progressive Mobility Level 9   Level of Assistance Stand by assistance   Assistive Device Walker   Time Tolerated 0-10 minutes   Activity Limited By No limitations

## 2023-07-27 NOTE — Progress Notes
Today's Date:  07/27/2023  Admission Date: 07/22/2023    Glucose control satisfactory.  The present insulin regimen consists of glargine 18 units daily and aspart 6 units with meals, plus mid dose correction factor.  He is eating well, with his wife's assistance.    Assessment:     1.  Type 2 diabetes with hyperglycemia    2.  Overweight    3.  CAD      Recommendations:     Will reduce basal insulin, resume metformin.    Glucose values over the past 24 hours:      Recent Labs     07/25/23  2114 07/26/23  0405 07/26/23  0618 07/26/23  1103 07/26/23  1651 07/26/23  2114 07/27/23  0357 07/27/23  0724   GLUPOC 194* 161* 214* 260* 225* 194* 182* 180*         Medications   Scheduled Meds:aspirin chewable tablet 81 mg, 81 mg, Oral, QDAY  atorvastatin (LIPITOR) tablet 80 mg, 80 mg, Oral, QHS  heparin (porcine) PF syringe 5,000 Units, 5,000 Units, Subcutaneous, Q8H  insulin aspart (U-100) (NOVOLOG FLEXPEN U-100 INSULIN) injection PEN 0-12 Units, 0-12 Units, Subcutaneous, 5 X Daily  insulin aspart (U-100) (NOVOLOG FLEXPEN U-100 INSULIN) injection PEN 6 Units, 6 Units, Subcutaneous, TID w/ meals  [START ON 07/28/2023] insulin glargine (LANTUS SOLOSTAR U-100 INSULIN) injection PEN 12 Units, 12 Units, Subcutaneous, QDAY(12)  lidocaine (LIDODERM) 5 % topical patch 1 patch, 1 patch, Topical, QDAY  metFORMIN (GLUCOPHAGE) tablet 500 mg, 500 mg, Oral, BID w/meals  methocarbamoL (ROBAXIN) tablet 750 mg, 750 mg, Oral, Q8H  polyethylene glycol 3350 (MIRALAX) packet 17 g, 1 packet, Oral, BID  sennosides-docusate sodium (SENOKOT-S) tablet 2 tablet, 2 tablet, Oral, BID  sotaloL (BETAPACE AF) tablet 160 mg, 160 mg, Oral, Q12H*  tamsulosin (FLOMAX) capsule 0.4 mg, 0.4 mg, Oral, QDAY after breakfast  warfarin (COUMADIN) tablet 5 mg, 5 mg, Oral, QHS    Continuous Infusions:  PRN and Respiratory Meds:acetaminophen Q6H PRN **OR** acetaminophen Q6H PRN, alum-mag hydroxide-simeth Q4H PRN, bisacodyL QDAY PRN, dextrose 50% (D50) IV PRN, hydrALAZINE Q6H PRN, LORazepam  (ATIVAN)  injection Q6H PRN, magnesium sulfate PRN **OR** magnesium oxide PRN, melatonin QHS PRN, milk of magnesium QDAY PRN, ondansetron Q6H PRN **OR** ondansetron (ZOFRAN) IV Q6H PRN, oxyCODONE Q4H PRN, potassium chloride PRN **OR** potassium chloride PRN **OR** potassium chloride in water PRN      Physical Examination                          Vital Signs: Last                  Vital Signs: 24 Hour Range   BP: 116/67 (11/09 0727)  Temp: 36.8 ?C (98.3 ?F) (11/09 1610)  Pulse: 86 (11/09 0727)  Respirations: 18 PER MINUTE (11/09 0727)  SpO2: 97 % (11/09 0727)  O2 Device: None (Room air) (11/09 0727) BP: (91-125)/(47-97)   Temp:  [36.8 ?C (98.2 ?F)-37.8 ?C (100 ?F)]   Pulse:  [67-119]   Respirations:  [18 PER MINUTE-29 PER MINUTE]   SpO2:  [92 %-98 %]   O2 Device: None (Room air)     General appearance: alert, oriented  HENT: mucus membranes moist  Eyes: Conj nl  Neck: supple  Lungs: clear  Chest: sternotomy wound  Heart: Regular rhythm, no murmur  Abdomen: soft, normal bowel sounds  Ext:  No edema  Skin: no rashes/lesions      Lab  Review     Point of Care Testing  (Last 24 hours)  Glucose: (!) 145 (07/27/23 0359)  POC Glucose (Download): (!) 180 (07/27/23 0724)    Recent Labs     07/25/23  0257 07/25/23  1446 07/25/23  2209 07/26/23  0400 07/26/23  1437 07/27/23  0359   NA 133* 135*  --  136*  --  136*   K 4.3 4.2 4.3 4.2 4.1 4.1   CL 100 102  --  101  --  102   CO2 28 26  --  28  --  25   GAP 5 7  --  7  --  9   BUN 31* 24  --  20  --  14   CR 0.67 0.62  --  0.61  --  0.65   GLU 162* 143*  --  145*  --  145*   CA 8.1* 8.3*  --  8.3*  --  8.1*   MG 2.0  --  1.9 2.1 2.0 2.0       Recent Labs     07/25/23  0257 07/25/23  1446 07/26/23  0400 07/27/23  0359   WBC 7.7 7.4 5.9 5.5   HGB 7.9* 8.4* 8.0* 8.2*   HCT 23.0* 25.2* 23.6* 24.6*   PLTCT 97* 124* 135* 181   PT 12.2  --  12.3 12.7   INR 1.1  --  1.1 1.2      Estimated Creatinine Clearance: 108 mL/min (based on SCr of 0.65 mg/dL).  Vitals:    07/25/23 0500 07/26/23 0500 07/27/23 0310   Weight: 94.7 kg (208 lb 12.4 oz) 95.3 kg (210 lb) 93.2 kg (205 lb 8 oz)        Thyroid Studies    Lab Results   Component Value Date/Time    TSH 3.17 12/11/2021 12:00 AM    No results found for: Edsel Petrin, [facility]

## 2023-07-27 NOTE — Progress Notes
Patient seen and examined. Formal note to follow.     POD5 CABG, tissue AVR, LAAL    He is doing pretty well. He has not quite ambulated a full lap yet but is motivated to do so. He is still in A Fib with RVR but is tolerating this well and asymptomatic. He is on room air. CXR with L>R basilar atelectasis and stable small-moderate left effusion. Anemic but Hb stable at 8.0.     We are planning on TEE/cardioversion Monday and continuing sotalol for now. We will continue aspirin, statin, and coumadin for A Fib for now. We are monitoring his left-sided effusion and may have to consider thoracentesis before discharge.     Joyce Gross, MD

## 2023-07-27 NOTE — Progress Notes
07/27/23 1011   Education   Person Instructed Patient   Patient Barriers To Learning None Noted   Interventions/Teaching Methods Verbal Instructions;Written Materials Provided   Patient Response Verbalized Understanding   Topics Outpatient Cardiac Rehab Referral Information;Home Walking Guidelines;Risk Factor Management - Physical Inactivity;Risk Factor Management - Stress;Risk Factor Management - Obesity;Risk Factor Management - Cholesterol;Risk Factor Management - Smoking;Risk Factor Management - Diabetes;Risk Factor Management - Blood Pressure;Reasons to call your doctor;Wound Care;Sternal and Upperbody restrictions for OHS;Angina and NTG use   Teaching Completed 07/27/23   Heart Resource Manual Given 07/27/23   Outpatient Cardiopulmonary Rehab   OPCR Yes   [facility]    [phone]   Date Faxed 07/27/23

## 2023-07-27 NOTE — Progress Notes
HC4 END OF SHIFT NURSING NOTE  Acute events, nursing interventions, and communication with providers:     AOx4, Tolerating RA.  LAst BM 11/8, UOP adequate.  Incisions c/d/I. Pain controlled with current regimen.  VSS per trend.    Cardiac Rhythm: SR, converted from afib around noon. [facility] APP notified.    Activity: Walk x1     Incentive Spirometer this shift: No    Intake and Output:        Intake/Output Summary (Last 24 hours) at 07/27/2023 1724  Last data filed at 07/27/2023 1706  Gross per 24 hour   Intake 700 ml   Output 2650 ml   Net -1950 ml          Last Bowel Movement Date: 07/26/23      Patient Education  Quality/Safety Education: Fall risk  Cardiac - Specific Education: Cardiac diet/nutrition  Other Patient education provided:   Learners: Patient  The following teaching method(s) were used: Verbal teaching  Response to learning: [employer] Understanding  Needs reinforcement on:     Patient Goal(s):  Patient will Pain will be </= 4 this shift  by the end of shift         Patient will  Report progressive increase in activity tolerance  by discharge date   Other:    Restraints:  No     Restraint Goal: Patient will be free from injury while physically restrained.  See Docflowsheet for restraint documentation, interventions, education, etc.

## 2023-07-27 NOTE — Progress Notes
Electrophysiology Progress Note      Admission Date: 07/22/2023  Today's Date: 07/27/2023  LOS: 5 days    Assessment & Plan   Darius Cherry is a 72 y.o. male patient with the following problems:    Principal Problem:    S/P CABG x 3  Active Problems:    Hyperlipemia    Coronary artery disease involving native coronary artery of native heart without angina pectoris    Overweight (BMI 25.0-29.9)    Essential hypertension    Nonrheumatic aortic valve stenosis    Type 2 diabetes mellitus without complication, without long-term current use of insulin (HCC)    Paroxysmal atrial fibrillation (HCC)    S/P AVR (aortic valve replacement)    Acute blood loss anemia    Platelet dysfunction (HCC)    Thrombocytopenia (HCC)    Vasogenic shock (HCC)    Stress hyperglycemia    SIRS without infection or organ dysfunction (HCC)    Post-operative pain    Anticoagulated on warfarin    Atrial fibrillation status post cardioversion [facility])    Alcohol abuse with withdrawal (HCC)      Assessment:  #Atrial Fibrillation s/p DCCV, CHADS-VASc 3  #S/p LAA Ligation on 11/6  Previously diagnosed with atrial fibrillation in 2023.  At that time, underwent cardioversion and has since been maintained on sotalol and Eliquis.  Currently admitted after undergoing CABG/AVR, with 2 unsuccessful cardioversions on 11/6.  TTE notable for severely dilated LA, patient asymptomatic.    Recommendations:  - Continue increased dose of sotalol to 160 mg BID   - QTc on 11/8 5:44AM:  - QTc post increased sotalol dose (11/8 10:30PM):  - Plan for TEE/cardioversion on Monday - If the above is unsuccessful, would consider initiation of amiodarone and OP EP follow-up (for consideration of ablation)  -Currently on warfarin for anticoagulation per cardiothoracic surgery.  Note that INR is at the subtherapeutic and need to be therapeutic prior to TEE/cardioversion on Monday.    Worthy Keeler, DO   Electrophysiology Fellow         Subjective:    Patient seen and examined at bedside.  No acute events overnight.  Patient states he feels overall well and has been working with cardiac rehab in order to increase his activity.  He continues to be in atrial fibrillation at this time.    Medications  Scheduled Meds:aspirin chewable tablet 81 mg, 81 mg, Oral, QDAY  atorvastatin (LIPITOR) tablet 80 mg, 80 mg, Oral, QHS  heparin (porcine) PF syringe 5,000 Units, 5,000 Units, Subcutaneous, Q8H  insulin aspart (U-100) (NOVOLOG FLEXPEN U-100 INSULIN) injection PEN 0-12 Units, 0-12 Units, Subcutaneous, 5 X Daily  insulin aspart (U-100) (NOVOLOG FLEXPEN U-100 INSULIN) injection PEN 6 Units, 6 Units, Subcutaneous, TID w/ meals  insulin glargine (LANTUS SOLOSTAR U-100 INSULIN) injection PEN 18 Units, 18 Units, Subcutaneous, QDAY  lidocaine (LIDODERM) 5 % topical patch 1 patch, 1 patch, Topical, QDAY  methocarbamoL (ROBAXIN) tablet 750 mg, 750 mg, Oral, Q8H  polyethylene glycol 3350 (MIRALAX) packet 17 g, 1 packet, Oral, BID  sennosides-docusate sodium (SENOKOT-S) tablet 2 tablet, 2 tablet, Oral, BID  [Held by Provider] sotaloL (BETAPACE AF) tablet 160 mg, 160 mg, Oral, Q12H*  tamsulosin (FLOMAX) capsule 0.4 mg, 0.4 mg, Oral, QDAY after breakfast  warfarin (COUMADIN) tablet 5 mg, 5 mg, Oral, QHS    Continuous Infusions:  PRN and Respiratory Meds:acetaminophen Q6H PRN **OR** acetaminophen Q6H PRN, alum-mag hydroxide-simeth Q4H PRN, bisacodyL QDAY PRN, dextrose 50% (D50) IV  PRN, hydrALAZINE Q6H PRN, LORazepam  (ATIVAN)  injection Q6H PRN, magnesium sulfate PRN **OR** magnesium oxide PRN, melatonin QHS PRN, milk of magnesium QDAY PRN, ondansetron Q6H PRN **OR** ondansetron (ZOFRAN) IV Q6H PRN, oxyCODONE Q4H PRN, potassium chloride PRN **OR** potassium chloride PRN **OR** potassium chloride in water PRN      Objective                       Vital Signs: Last Filed                 Vital Signs: 24 Hour Range   BP: 116/67 (11/09 0727)  Temp: 36.8 ?C (98.3 ?F) (11/09 6213)  Pulse: 130 (11/09 0727)  Respirations: 18 PER MINUTE (11/09 0727)  SpO2: 97 % (11/09 0727)  O2 Device: None (Room air) (11/09 0727) BP: (91-126)/(47-97)   Temp:  [36.6 ?C (97.9 ?F)-37.8 ?C (100 ?F)]   Pulse:  [67-130]   Respirations:  [17 PER MINUTE-29 PER MINUTE]   SpO2:  [92 %-100 %]   O2 Device: None (Room air)     Vitals:    07/25/23 0500 07/26/23 0500 07/27/23 0310   Weight: 94.7 kg (208 lb 12.4 oz) 95.3 kg (210 lb) 93.2 kg (205 lb 8 oz)         Intake/Output Summary:  (Last 24 hours)    Intake/Output Summary (Last 24 hours) at 07/27/2023 0759  Last data filed at 07/27/2023 0617  Gross per 24 hour   Intake 840 ml   Output 2700 ml   Net -1860 ml           Body mass index is 28.66 kg/m?Marland Kitchen    Physical Exam        GEN: no acute distress  HEENT: nontraumatic, MM-moist, neck supple, no JVD  CHEST: clear to auscultation bilaterally  HEART: irregularly irregular; nml S1 & S2, no S3 or S4; no rub; no murmurs  ABD: soft, nontender, BS+  EXT: no c/c/e  NEURO: A&Ox3  SKIN: warm and dry    Lab Review  Hematology:    Lab Results   Component Value Date    HGB 8.2 07/27/2023    HCT 24.6 07/27/2023    PLTCT 181 07/27/2023    WBC 5.5 07/27/2023    NEUT 64.2 06/05/2023    ANC 3.40 06/05/2023    ALC 1.16 06/05/2023    MONA 8.1 06/05/2023    AMC 0.43 06/05/2023    EOSA 1 10/22/2005    ABC 0.05 06/05/2023    MCV 93.8 07/27/2023    MCH 31.4 07/27/2023    MCHC 33.4 07/27/2023    MPV 9.6 07/27/2023    RDW 13.7 07/27/2023   , Coagulation:    Lab Results   Component Value Date    PT 12.7 07/27/2023    PTT 27.9 07/22/2023    INR 1.2 07/27/2023   , and General Chemistry:    Lab Results   Component Value Date    NA 136 07/27/2023    K 4.1 07/27/2023    CL 102 07/27/2023    CO2 25 07/27/2023    GAP 9 07/27/2023    BUN 14 07/27/2023    CR 0.65 07/27/2023    GLU 145 07/27/2023    GLU 140 10/23/2005    CA 8.1 07/27/2023    ALBUMIN 4.0 07/19/2023    MG 2.0 07/27/2023    TOTBILI 0.8 07/19/2023    PO4 2.6 10/22/2005     Telemetry- atrial  fibrillation with controlled ventricular response (HR 70-90bpm)

## 2023-07-27 NOTE — Progress Notes
07/27/23 1011   Cardiac Rehab Activity   Distance Walked (feet) 360 ft   Ambulation Goal 720 ft   Patient Position Supine   BP Pre-activity 116/77   BP Post-activity 117/87   HR Pre-activity 104 bpm   HR Post-activity 123   SaO2 Pre-activity 96 %   SaO2 Post-activity 94   O2 Device None (Room Air)   Comments Patient walked full lap around the unit with walker, tolerated well. Patient voiced no complaints and presented with steady gait. Patient denies any symptoms of lightheadedness or dizziness. Patient was assisted back to chair at completion.   Mobility   Progressive Mobility Level 9   Level of Assistance Stand by assistance   Assistive Device Walker   Time Tolerated 0-10 minutes   Activity Limited By No limitations

## 2023-07-27 NOTE — Progress Notes
Report Sheet    TEE/DCCV           Indication: Afib  Ordering Provider: Milas Kocher    Name: Darius Cherry     Age: 72 y.o.    DOB: December 06, 1950     MRN: [id number]    RM #: [facility] [id number]     RN:      Phone #:      Isolation:      Communication Barrier:      NPO      A&O      Travel      IV      O2      Additional Information: on sotalol PTA, dose increased to 160 mg BID post-op, transitioning from eliquis to warfarin d/t new AVR??    Lab(s) needed: check AM labs (K/Mg, glucose)  Other: EKG  Device check needed: No    Device: No results found for: GENERATOR, EPDEVTYP    Prior TEE: None in records     Prior DCCV: 07/23/26 (bedside by ICU staff) - 200j x2 unsuccessful     Prior Echo:04/17/23   Previous TEE/DCCV details:   Mild left ventricular dilatation. Normal left ventricular systolic function with an estimated ejection fraction of 65%.  Left ventricular diastolic function cannot be determined.  Normal right ventricular size and systolic function.  Severe left atrial enlargement, mild right atrial enlargement.  Heavily calcified aortic valve with significantly restricted cusp mobility.  Doppler evaluation consistent with severe aortic stenosis and moderate aortic insufficiency.  V-max = 4.5 m/s, DVI = 0.22, MG = 62 mmHg, AVA = 0.72, SVI = 41 mL/m?, PHT = 473 ms  Normal central venous pressure (0-5 mm Hg).  Estimated PASP = 39 mmHg.  No pericardial effusion.  Comparison is made to a transthoracic study dated 01/08/22.  There has been an increase in left ventricular size, previously LV EDVI was 41 mL/m? and is now 79 mL/m?Marland Kitchen  There has been interval worsening of aortic stenosis and aortic regurgitation.  On the prior study, there was moderate aortic stenosis by doppler assessment (AVA=1.4cm2, MG=44mmHg, DVI=0.31) and trace aortic regurgitation.  There continues to be preservation of biventricular function.    EF:   ECHO EF   Date Value Ref Range Status   04/17/2023 70 % Final       Anticoag:Warfarin     Dose:5 mg Frequency:Daily     Last Dose:_____     Missed:______ (previously on Eliquis, also on subq heparin 5000u)    LAB:   Lab Results   Component Value Date/Time    INR 1.2 07/27/2023 03:59 AM    PTT 27.9 07/22/2023 03:25 PM    HGB 8.2 (L) 07/27/2023 03:59 AM    PLTCT 181 07/27/2023 03:59 AM    GLU 145 (H) 07/27/2023 03:59 AM    GLU 140 (H) 10/23/2005 04:58 AM    NA 136 (L) 07/27/2023 03:59 AM    K 4.1 07/27/2023 03:59 AM    CR 0.65 07/27/2023 03:59 AM       Probe   Gastric Surgery    GERD    Swallow difficulty    Head/Neck Surg/Rad    Chest Surg/Rad CABGx3/AVR/LAAL on 11/4   EGD    Varices/Esophag CA    GI bleed    Dental issues      Sedation   COPD    OSA/CPAP    Asthma    Pulm HTN    Anesthesia Issues    Smoker  ETOH & Frequency Yes, detoxing??   Drug Use    Chronic Pain Med    GLP-1 Last dose: N/A     Current Medications:   No current outpatient medications on file.       Past Medical/Surgical History:   Patient Active Problem List    Diagnosis Date Noted    Alcohol abuse with withdrawal (HCC) 07/26/2023    Atrial fibrillation status post cardioversion (HCC) 07/25/2023    Post-operative pain 07/24/2023    Anticoagulated on warfarin 07/24/2023    Thrombocytopenia (HCC) 07/23/2023    Vasogenic shock (HCC) 07/23/2023    Stress hyperglycemia 07/23/2023    SIRS without infection or organ dysfunction (HCC) 07/23/2023    S/P CABG x 3 07/22/2023    S/P AVR (aortic valve replacement) 07/22/2023    Acute blood loss anemia 07/22/2023    Platelet dysfunction (HCC) 07/22/2023    Paroxysmal atrial fibrillation (HCC) 01/18/2022    Pain from implanted hardware 03/28/2018    Left ankle pain 10/08/2016    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    Essential hypertension 05/12/2015    Nonrheumatic aortic valve stenosis 05/12/2015    Type 2 diabetes mellitus without complication, without long-term current use of insulin (HCC) 05/12/2015    Overweight (BMI 25.0-29.9) 11/11/2014    Hyperlipemia 04/26/2009    Coronary artery disease involving native coronary artery of native heart without angina pectoris 04/26/2009      Past Medical History:   Diagnosis Date    Aortic valve stenosis     Atrial fibrillation (HCC)     CAD (coronary artery disease) 04/26/2009    DM (diabetes mellitus) (HCC)     Fracture 2016    Left ankle    History of coronary artery stent placement 2007/ 2013    x4    Hyperlipemia 04/26/2009    Hypertension 04/26/2009    Murmur, cardiac     Myocardial infarction [facility]) 2007    x2 stents    Skin cancer     lips, head      Surgical History:   Procedure Laterality Date    ARTHROPLASTY REPLACEMENT TOTAL ANKLE ([facility] CASE [phone]) Left 11/05/2016    Performed by Ree Shay, MD at [facility] OR    LENGTHENING LEFT  ACHILLES TENDON Left 11/05/2016    Performed by Ree Shay, MD at [facility] OR    ARTHROPLASTY REVISION TOTAL ANKLE TO IN-BONE ([facility] #[id number]) Left 03/28/2018    Performed by Ree Shay, MD at [facility] OR    REMOVAL HARDWARE - DEEP - LOWER EXTREMITY  03/28/2018    Performed by Ree Shay, MD at [facility] OR    ANGIOGRAPHY CORONARY ARTERY WITH LEFT HEART CATHETERIZATION N/A 06/10/2023    Performed by Laney Pastor, MD at [facility] CATH LAB    PERCUTANEOUS CORONARY STENT PLACEMENT WITH ANGIOPLASTY N/A 06/10/2023    Performed by Laney Pastor, MD at [facility] CATH LAB    Coronary artery bypass grafting x3, left internal mammary artery take down, endoscopic vein harvest, Aortic Valve Replacement- tissue, left atrial appendage ligation. N/A 07/22/2023    Performed by Arby Barrette, MD at [facility] CVOR    CORONARY ANGIOPLASTY      HAND SURGERY Left     HX CORONARY STENT PLACEMENT      HX HEART CATHETERIZATION  2007, 05/2012    x4 stents total    SKIN CANCER EXCISION      left ear flap       Allergies:  Allergies   Allergen Reactions    Celebrex [Celecoxib] RASH and ITCHING     Baron Sane, RN

## 2023-07-27 NOTE — Patient Education
Pharmacy Anticoagulation Teaching    Irene Pap was provided with both verbal and written drug information about Warfarin. Discussion with Mr. Tensley included: the medication regimen, dosing, monitoring, possible adverse effects, food/drug interactions to be aware of and OTC/herbal medication use. Emphasis was placed on the importance of medication compliance. Mr. Dudzik was also encouraged to contact the pharmacist with any further questions.    Vernia Buff, Pharmacy Intern  07/27/2023

## 2023-07-27 NOTE — Progress Notes
HC4 END OF SHIFT NURSING NOTE  Acute events, nursing interventions, and communication with providers:   Rinaldo Ratel, MD approx [phone] for 2hr post Sotalol EKG. Orders to hold next dose until EP team can follow up. Okay to skip 0500 EKG per Christiane Ha, NP.     Cardiac Rhythm: Afib    Activity: Other       If Other, please explain:     Incentive Spirometer this shift: No  Max volume:     Intake and Output:        Intake/Output Summary (Last 24 hours) at 07/27/2023 6269  Last data filed at 07/27/2023 0617  Gross per 24 hour   Intake 840 ml   Output 2750 ml   Net -1910 ml          Last Bowel Movement Date: 07/26/23      Patient Education  Quality/Safety Education: Fall risk, Pain scale , and Wound/incision care  Cardiac - Specific Education: Post cardiac procedure care:    Other Patient education provided:   Learners: Patient  The following teaching method(s) were used: Verbal teaching  Response to learning: Verbalizes Understanding  Needs reinforcement on:     Patient Goal(s):  Patient will Pain will be </= 4 this shift  by the end of shift         Patient will  Verbalize readiness for discharge  by discharge date   Other:    Restraints:  No     Restraint Goal: Patient will be free from injury while physically restrained.  See Docflowsheet for restraint documentation, interventions, education, etc.

## 2023-07-27 NOTE — Progress Notes
Cardiothoracic Surgery Daily Progress Note     Darius Cherry  [id number] Date:  07/27/2023  Admission Date: 07/22/2023  LOS: 5 days    07/22/23 -   Procedure(s):  Coronary artery bypass grafting x3, left internal mammary artery take down, endoscopic vein harvest, Aortic Valve Replacement- tissue, left atrial appendage ligation.    POD#: 5    Principal Problem:    S/P CABG x 3  Active Problems:    Hyperlipemia    Coronary artery disease involving native coronary artery of native heart    Overweight (BMI 25.0-29.9)    Essential hypertension    Type 2 diabetes mellitus without complication, without long-term current use of insulin (HCC)    Paroxysmal atrial fibrillation (HCC)    S/P AVR (aortic valve replacement)    Acute blood loss anemia    Anticoagulated on warfarin    Atrial fibrillation status post cardioversion [facility])    Alcohol abuse with withdrawal (HCC)    S/P left atrial appendage ligation    Pleural effusion, bilateral    Hypocalcemia    Physical deconditioning    Acute on chronic diastolic CHF (congestive heart failure), NYHA class 2 (HCC)    Atherosclerosis of aorta (HCC)    Subjective: Denies shortness of breath, dizziness, lightheadedness or chest discomfort with atrial fibrillation RVR.    Overnight Events: Transferred from [facility]ICU yesterday. No acute events.    Assessment/Plan:      Neuro/Psych - Pain controlled. Continue PRN oxycodone, Tylenol, robaxin & Lidoderm patches. Hx of daily ETOH use (6 drinks daily, last on 10/31).  Mild agitation 11/7, with diaphoresis, tachycardia, borderline febrile. Suspect some withdrawal, responded well to 0.25mg  IV ativan. Continue Q6hr PRN for s/s of withdrawal. Has been somewhat somnolent overall since surgery. Pta pain meds: APAP. Pta psych meds: None. No h/o known cerebrovascular disease.     CV - SR--> AFib ~ 0600 on 11/6. Attempted DCCV x2 on 11/6, unsuccessful. Remains AF, rate 90-100s overnight, up to 120 transiently with activity this AM. Continue PTA sotalol. Consulted EP for recommendations regarding sotalol dosing and overall rhythm management. Per EP plan for TEE/cardioversion on 11/11. SBP 90-110s. Cont ASA, statin. No Amio ppx with sotalol. Hold BB for hypotension.  Takes coreg 3.125 at home. Post-procedure TEE LVEF 55-60%. +acute on chronic diastolic heart failure, NYHA class II. Atherosclerosis of aorta: yes. Pulmonary hypertension: No.  NT-Pro-BNP   Date/Time Value Ref Range Status   07/19/2023 02:21 PM 497.0 (H) <125 pg/mL Final     Comment:     NOTE: Normal reference ranges for NT-proBNP vary by age:  <125 pg/mL for individuals < 48 years old  <450 pg/mL for individuals =/> 12 years old        Resp - SpO2 92-94% on RA. Daily CXR: bilateral effusions. Cont IS, aggressive pulm toilet. Chest tubes removed. COPD: No. Asthma: No. OSA: No.  Lab Results   Component Value Date    FVCPRE 3.10 06/10/2023    FVCPREDPRE 75 06/10/2023    FEV1PRE 2.51 06/10/2023    FEV1PREDPRE 82 06/10/2023     Renal/FEN - Baseline sCr. 0.9. Today sCr 0.65. No h/o CKD. UOP: 2.7 L/24hr, , net -19 L/24hr, overall -400 mL/admission. Weight: +1.6 kg/admission. Monitor hypocalcemia and replace lytes PRN per protocol to minimize risk of cardiac arrhythmias. Has not received diuresis post op d/t low filling pressures, continues to auto-diurese. Pta electrolytes/ diuretics: None. BPH Meds: Cont PTA flomax.  Discontinued foley catheter.    GI - Cont  cardiac/ ADA diet. Last BM: 11/8, continue post op bowel regimen. S/p Relistor 11/6, repeated 11/7. No known chronic liver dysfunction. +Daily EtOH use. Obesity: no. Body mass index is 28.66 kg/m?.    ID - Afebrile. WBC 5.5. Monitor for s/s infection. Currently holding pta augmentin for ingrown toenail.    Heme - Hgb 8.2. Monitor acute blood loss anemia. Thrombocytopenia: resolved. Coagulopathy: No. Antiplatelet: No. Anticoagulation: Cont SQ heparin and mechanical DVT prophylaxis. Holding PTA Eliquis, started on Warfarin 11/5 (tissue valve), continue QHS. Daily INR - Will need INR management arranged. Hold off on Heparin bridge for AFib per CTS, s/p left atrial appendage ligation, ongoing discussion.     Endo -  A1c 7.4%, FSBS 145-260. Endocrine following - appreciate assistance. Cont MDCF, aspart 6, & Lantus 12. Resume pta metformin 500 mg BID. Hold pta jardiance  and rybelsus for now. No h/o thyroid disorder.     Activity - Pt up to chair today, should ambulate in halls with nursing and cardiac rehab TID.  Ambulated 275' yesterday.  Cont to increase as tolerated. Age related physical debility: yes. Chronic fatigue: No.    Disposition: Cont sotalol per EP. Plan for DCCV/TEE Monday 11/11 if necessary. Autodiuresing. Cont new coumadin for valve ppx/ atrial fibrillation. Will need INR management arranged (follows w Dr. Barry Dienes). Appreciate Endocrinology input. Cont to increase activity. DC 2-3 days.      Prophylaxis Review:  Lines:  No  Antibiotic Usage:  No  VTE: Pharmacological prophylaxis: SQ heparin and Warfarin and Mechanical prophylaxis: TED hose  Urinary Catheter: No  Temporary Pacing Wires Present?: No Date Removed: 11/7    Saralyn Pilar, PA-C   Cardiothoracic Surgery  Available via voalte or Pager [phone]  07/27/2023    Subjective:       HPI:   Darius Cherry is a 72 y.o. male whose current medical conditions include CAD s/p PCI to circumflex in 2007 and then PCI to circumflex and LAD in 2013, paroxysmal afib on apixaban, HTN, HLD, DM2, severe aortic stenosis (follows with Dr. Barry Dienes). Referred to [facility] valve clinic. Found to have CAD. He is now s/p CABG/AVR (t) with Dr. Sindy Messing.      11/5: wean norepi, dc PAC, possible tele   11/6: Afib, low BP, +volume and pressor, resume sotalol, relistor, pain control, failed DCCV x2  11/7: likely withdrawal, +PRN ativan, DC wires/CTs, spot diuresis, repeat relistor, no heparin bridge per CTS  11/8: Afib, Consult EP, Dc foley, start SQ heparin    Review of Systems   Constitutional:  Negative for chills and fever. Eyes:  Negative for blurred vision and double vision.   Respiratory:  Negative for cough and shortness of breath.    Cardiovascular:  Negative for chest pain, palpitations and leg swelling.        Positive for left sided chest pain related to chest tube    Gastrointestinal:  Negative for abdominal pain, nausea and vomiting.   Neurological:  Negative for dizziness, tremors, sensory change, speech change, focal weakness and headaches.   Psychiatric/Behavioral:  The patient is not nervous/anxious.           Objective:        Medications:  Scheduled Meds:aspirin chewable tablet 81 mg, 81 mg, Oral, QDAY  atorvastatin (LIPITOR) tablet 80 mg, 80 mg, Oral, QHS  heparin (porcine) PF syringe 5,000 Units, 5,000 Units, Subcutaneous, Q8H  insulin aspart (U-100) (NOVOLOG FLEXPEN U-100 INSULIN) injection PEN 0-12 Units, 0-12 Units, Subcutaneous, 5 X Daily  insulin aspart (U-100) (NOVOLOG FLEXPEN U-100 INSULIN) injection PEN 6 Units, 6 Units, Subcutaneous, TID w/ meals  [START ON 07/28/2023] insulin glargine (LANTUS SOLOSTAR U-100 INSULIN) injection PEN 12 Units, 12 Units, Subcutaneous, QDAY(12)  lidocaine (LIDODERM) 5 % topical patch 1 patch, 1 patch, Topical, QDAY  metFORMIN (GLUCOPHAGE) tablet 500 mg, 500 mg, Oral, BID w/meals  methocarbamoL (ROBAXIN) tablet 750 mg, 750 mg, Oral, Q8H  polyethylene glycol 3350 (MIRALAX) packet 17 g, 1 packet, Oral, BID  sennosides-docusate sodium (SENOKOT-S) tablet 2 tablet, 2 tablet, Oral, BID  sotaloL (BETAPACE AF) tablet 160 mg, 160 mg, Oral, Q12H*  tamsulosin (FLOMAX) capsule 0.4 mg, 0.4 mg, Oral, QDAY after breakfast  warfarin (COUMADIN) tablet 5 mg, 5 mg, Oral, QHS    Continuous Infusions:      PRN and Respiratory Meds:acetaminophen Q6H PRN **OR** acetaminophen Q6H PRN, alum-mag hydroxide-simeth Q4H PRN, bisacodyL QDAY PRN, dextrose 50% (D50) IV PRN, hydrALAZINE Q6H PRN, LORazepam  (ATIVAN)  injection Q6H PRN, magnesium sulfate PRN **OR** magnesium oxide PRN, melatonin QHS PRN, milk of magnesium QDAY PRN, ondansetron Q6H PRN **OR** ondansetron (ZOFRAN) IV Q6H PRN, oxyCODONE Q4H PRN, potassium chloride PRN **OR** potassium chloride PRN **OR** potassium chloride in water PRN                       Vital Signs: Last Filed                  Vital Signs: 24 Hour Range   BP: 93/44 (11/09 1546)  Temp: 37.2 ?C (99 ?F) (11/09 1546)  Pulse: 89 (11/09 1546)  Respirations: 16 PER MINUTE (11/09 1546)  SpO2: 92 % (11/09 1546)  O2 Device: None (Room air) (11/09 1546) BP: (91-124)/(44-86)   Temp:  [36.8 ?C (98.2 ?F)-37.8 ?C (100 ?F)]   Pulse:  [67-119]   Respirations:  [16 PER MINUTE-20 PER MINUTE]   SpO2:  [92 %-98 %]   O2 Device: None (Room air)     Vitals:    07/25/23 0500 07/26/23 0500 07/27/23 0310   Weight: 94.7 kg (208 lb 12.4 oz) 95.3 kg (210 lb) 93.2 kg (205 lb 8 oz)           Intake/Output Summary:  (Last 24 hours)    Intake/Output Summary (Last 24 hours) at 07/27/2023 1622  Last data filed at 07/27/2023 1159  Gross per 24 hour   Intake 350 ml   Output 2300 ml   Net -1950 ml         Physical Exam:         Neuro: A&O x4  Cardiovascular: irreg   Respiratory: LS CTA bilaterally - diminished bases  GI: soft, NT, active BS  Extremities: No Edema  Incisions: Sternal incision dressing, dry and intact. No crepitus or sternal instability.        Pertinent Meds:               Taking           Reason for Not Taking  1. Aspirin Yes     2. B-Blocker Yes - Sotalol    3. Statin Yes     4. ACE/ARB No No: No Indication (LVEF >40%)         LABS:  Recent Labs     07/25/23  0257 07/25/23  1446 07/25/23  2209 07/26/23  0400 07/26/23  1437 07/27/23  0359   NA 133* 135*  --  136*  --  136*   K 4.3 4.2  4.3 4.2 4.1 4.1   CL 100 102  --  101  --  102   CO2 28 26  --  28  --  25   GAP 5 7  --  7  --  9   BUN 31* 24  --  20  --  14   CR 0.67 0.62  --  0.61  --  0.65   GLU 162* 143*  --  145*  --  145*   CA 8.1* 8.3*  --  8.3*  --  8.1*   MG 2.0  --  1.9 2.1 2.0 2.0       Recent Labs     07/25/23  0257 07/25/23  1446 07/26/23  0400 07/27/23  0359   WBC 7.7 7.4 5.9 5.5   HGB 7.9* 8.4* 8.0* 8.2*   HCT 23.0* 25.2* 23.6* 24.6*   PLTCT 97* 124* 135* 181   PT 12.2  --  12.3 12.7   INR 1.1  --  1.1 1.2      Estimated Creatinine Clearance: 108 mL/min (based on SCr of 0.65 mg/dL).  Vitals:    07/25/23 0500 07/26/23 0500 07/27/23 0310   Weight: 94.7 kg (208 lb 12.4 oz) 95.3 kg (210 lb) 93.2 kg (205 lb 8 oz)      No results for input(s): PHART, PO2ART in the last 72 hours.    Invalid input(s): PC02A          Radiology and Other Diagnostic Procedures Review:    Reviewed

## 2023-07-27 NOTE — Progress Notes
Cardiology Plan of Care    Patient received first dose of increased sotalol 160 mg 11/8 evening.  EKG 2 hours post dose with QTc near 500.    Please hold a.m. dose until further evaluation by EP team.    Ellison Hughs, MD  Cardiovascular Disease Fellow  [facility]

## 2023-07-28 ENCOUNTER — Inpatient Hospital Stay: Admit: 2023-07-28 | Discharge: 2023-07-28 | Payer: MEDICARE

## 2023-07-28 LAB — ECG 12-LEAD
P AXIS: 67 degrees
P-R INTERVAL: 196 ms
Q-T INTERVAL: 486 ms
QRS DURATION: 104 ms
QTC CALCULATION (BAZETT): 505 ms
R AXIS: 40 degrees
T AXIS: 10 degrees
VENTRICULAR RATE: 65 {beats}/min

## 2023-07-28 LAB — POC GLUCOSE
~~LOC~~ BKR POC GLUCOSE: 156 mg/dL — ABNORMAL HIGH (ref 70–100)
~~LOC~~ BKR POC GLUCOSE: 130 mg/dL — ABNORMAL HIGH (ref 70–100)
~~LOC~~ BKR POC GLUCOSE: 159 mg/dL — ABNORMAL HIGH (ref 70–100)
~~LOC~~ BKR POC GLUCOSE: 194 mg/dL — ABNORMAL HIGH (ref 70–100)

## 2023-07-28 NOTE — Progress Notes
07/28/23 1315   Cardiac Rehab Activity   Comments Declined ambulation today from Cardiac Rehab.

## 2023-07-28 NOTE — Progress Notes
HC4 END OF SHIFT NURSING NOTE  Acute events, nursing interventions, and communication with providers:     AOx4, Tolerating RA.  Last BM 11/8, UOP adequate.  Incisions c/d/I. Pain controlled with current regimen.  VSS per trend.    Cardiac Rhythm: SR c PACs  Activity: Other Pt refusing today     Incentive Spirometer this shift: No    Intake and Output:        Intake/Output Summary (Last 24 hours) at 07/28/2023 1751  Last data filed at 07/28/2023 1621  Gross per 24 hour   Intake 1150 ml   Output 1400 ml   Net -250 ml          Last Bowel Movement Date: 07/26/23      Patient Education  Quality/Safety Education: Fall risk  Cardiac - Specific Education: Cardiac diet/nutrition  Other Patient education provided:   Learners: Patient  The following teaching method(s) were used: Verbal teaching  Response to learning: [employer] Understanding  Needs reinforcement on:     Patient Goal(s):  Patient will Pain will be </= 4 this shift  by the end of shift         Patient will  Report progressive increase in activity tolerance  by discharge date   Other:    Restraints:  No     Restraint Goal: Patient will be free from injury while physically restrained.  See Docflowsheet for restraint documentation, interventions, education, etc.

## 2023-07-28 NOTE — Progress Notes
Cardiology Plan of Care    Assessment:  EKG reviewed post dose 2 of sotalol  160 mcg. QTc is 450.  Previous QTc is 434.  Current QTc appears to be stable and is within acceptable limits.     Plan:  Continue the current dose of sotalol and obtain EKG 2 hours after the next dose.       Ellison Hughs, MD  Cardiovascular Disease Fellow  [facility]

## 2023-07-28 NOTE — Progress Notes
Cardiothoracic Surgery Daily Progress Note     Darius Cherry  [id number] Date:  07/28/2023  Admission Date: 07/22/2023  LOS: 6 days    07/22/23 -   Procedure(s):  Coronary artery bypass grafting x3, left internal mammary artery take down, endoscopic vein harvest, Aortic Valve Replacement- tissue, left atrial appendage ligation.    POD#: 6    Principal Problem:    S/P CABG x 3  Active Problems:    Hyperlipemia    Coronary artery disease involving native coronary artery of native heart    Overweight (BMI 25.0-29.9)    Essential hypertension    Type 2 diabetes mellitus without complication, without long-term current use of insulin (HCC)    Paroxysmal atrial fibrillation (HCC)    S/P AVR (aortic valve replacement)    Acute blood loss anemia    Anticoagulated on warfarin    Atrial fibrillation status post cardioversion [facility])    Alcohol abuse with withdrawal (HCC)    S/P left atrial appendage ligation    Pleural effusion, bilateral    Hypocalcemia    Acute on chronic diastolic CHF (congestive heart failure), NYHA class 2 (HCC)    Atherosclerosis of aorta (HCC)    Hypokalemia    Subjective: Denies complaints.    Overnight Events:  No acute events.    Assessment/Plan:      Neuro/Psych - Pain controlled. Continue PRN oxycodone, Tylenol, robaxin & Lidoderm patches. Hx of daily ETOH use (6 drinks daily, last on 10/31).  Mild agitation 11/7, with diaphoresis, tachycardia, borderline febrile. Suspect some withdrawal, responded well to 0.25mg  IV ativan. Continue Q6hr PRN for s/s of withdrawal. Has been somewhat somnolent overall since surgery. Pta pain meds: APAP. Pta psych meds: None. No h/o known cerebrovascular disease.     CV - SR--> AFib ~ 0600 on 11/6. Attempted DCCV x2 on 11/6, unsuccessful. Converted to NSR/ PACs/iRBBB 11/9.  Currently 60-80s. Continue PTA sotalol. Consulted EP for recommendations regarding sotalol dosing and overall rhythm management. Per EP plan for TEE/cardioversion on 11/11 if necessary. SBP 90-110s. Cont ASA, statin. No Amio ppx with sotalol. Hold BB for hypotension.  Takes coreg 3.125 at home. Post-procedure TEE LVEF 55-60%. +acute on chronic diastolic heart failure, NYHA class II. Atherosclerosis of aorta: yes. Pulmonary hypertension: No.  NT-Pro-BNP   Date/Time Value Ref Range Status   07/19/2023 02:21 PM 497.0 (H) <125 pg/mL Final     Comment:     NOTE: Normal reference ranges for NT-proBNP vary by age:  <125 pg/mL for individuals < 61 years old  <450 pg/mL for individuals =/> 43 years old        Resp - SpO2 91-97% on RA. Daily CXR: left pleural effusion. Cont IS, aggressive pulm toilet. Chest tubes removed. COPD: No. Asthma: No. OSA: No.  Lab Results   Component Value Date    FVCPRE 3.10 06/10/2023    FVCPREDPRE 75 06/10/2023    FEV1PRE 2.51 06/10/2023    FEV1PREDPRE 82 06/10/2023     Renal/FEN - Baseline sCr. 0.9. Today sCr 0.76. No h/o CKD. UOP: 1.8 L/24hr, , net -350 mL/24hr, overall -750 mL/admission. Weight: +0.5 kg/admission. Monitor hypokalemia and hypocalcemia and replace lytes PRN per protocol to minimize risk of cardiac arrhythmias. Has not received diuresis post op d/t low filling pressures, continues to auto-diurese. Pta electrolytes/ diuretics: None. BPH Meds: Cont PTA flomax.  Discontinued foley catheter 11/8.    GI - Cont cardiac/ ADA diet. Last BM: 11/9, continue post op bowel regimen. S/p Relistor 11/6,  repeated 11/7. No known chronic liver dysfunction. +Daily EtOH use. Obesity: no. Body mass index is 28.33 kg/m?.    ID - Afebrile. WBC pending. Monitor for s/s infection. Currently holding pta augmentin for ingrown toenail.    Heme - Hgb pending. Monitor acute blood loss anemia. Thrombocytopenia: resolved. Coagulopathy: No. Antiplatelet: No. Anticoagulation: Cont SQ heparin and mechanical DVT prophylaxis. Holding PTA Eliquis (pAF), started on Warfarin 11/5 (tissue valve), continue QHS. Daily INR - 1.2 today. Will need INR management arranged. Hold off on Heparin bridge for AFib per CTS, s/p left atrial appendage ligation, ongoing discussion.     Endo -  A1c 7.4%, FSBS 130-256. Endocrine following - appreciate assistance. Cont MDCF, aspart 6, & Lantus 12. Cont pta metformin 500 mg BID. Hold pta jardiance  and rybelsus for now. No h/o thyroid disorder.     Activity - Pt up to chair today, should ambulate in halls with nursing and cardiac rehab TID.  Ambulated 360' yesterday. Anticipate dc home w assist when medically stable. Cont to increase as tolerated. Age related physical debility: yes. Chronic fatigue: No.    Disposition: Cont sotalol per EP. Plan for DCCV/TEE Monday 11/11 if necessary and INR therapeutic. Autodiuresing. Cont new coumadin for valve ppx/ atrial fibrillation. Will need INR management arranged (follows w Dr. Barry Dienes). Appreciate Endocrinology input. Cont to increase activity. DC 1-2 days.      Prophylaxis Review:  Lines:  No  Antibiotic Usage:  No  VTE: Pharmacological prophylaxis: SQ heparin and Warfarin and Mechanical prophylaxis: TED hose  Urinary Catheter: No  Temporary Pacing Wires Present?: No Date Removed: 11/7    Saralyn Pilar, PA-C   Cardiothoracic Surgery  Available via voalte or Pager [phone]  07/28/2023    Subjective:       HPI:   Darius Cherry is a 72 y.o. male whose current medical conditions include CAD s/p PCI to circumflex in 2007 and then PCI to circumflex and LAD in 2013, paroxysmal afib on apixaban, HTN, HLD, DM2, severe aortic stenosis (follows with Dr. Barry Dienes). Referred to [facility] valve clinic. Found to have CAD. He is now s/p CABG/AVR (t) with Dr. Sindy Messing.      11/5: wean norepi, dc PAC, possible tele   11/6: Afib, low BP, +volume and pressor, resume sotalol, relistor, pain control, failed DCCV x2  11/7: likely withdrawal, +PRN ativan, DC wires/CTs, spot diuresis, repeat relistor, no heparin bridge per CTS  11/8: Afib, Consult EP, Dc foley, start SQ heparin    Review of Systems   Constitutional:  Negative for chills and fever.   Eyes:  Negative for blurred vision and double vision.   Respiratory:  Negative for cough and shortness of breath.    Cardiovascular:  Negative for chest pain, palpitations and leg swelling.        Positive for left sided chest pain related to chest tube    Gastrointestinal:  Negative for abdominal pain, nausea and vomiting.   Neurological:  Negative for dizziness, tremors, sensory change, speech change, focal weakness and headaches.   Psychiatric/Behavioral:  The patient is not nervous/anxious.           Objective:        Medications:  Scheduled Meds:aspirin chewable tablet 81 mg, 81 mg, Oral, QDAY  atorvastatin (LIPITOR) tablet 80 mg, 80 mg, Oral, QHS  heparin (porcine) PF syringe 5,000 Units, 5,000 Units, Subcutaneous, Q8H  insulin aspart (U-100) (NOVOLOG FLEXPEN U-100 INSULIN) injection PEN 0-12 Units, 0-12 Units, Subcutaneous, 5 X Daily  insulin aspart (U-100) (NOVOLOG FLEXPEN U-100 INSULIN) injection PEN 6 Units, 6 Units, Subcutaneous, TID w/ meals  insulin glargine (LANTUS SOLOSTAR U-100 INSULIN) injection PEN 12 Units, 12 Units, Subcutaneous, QDAY(12)  lidocaine (LIDODERM) 5 % topical patch 1 patch, 1 patch, Topical, QDAY  metFORMIN (GLUCOPHAGE) tablet 500 mg, 500 mg, Oral, BID w/meals  methocarbamoL (ROBAXIN) tablet 750 mg, 750 mg, Oral, Q8H  polyethylene glycol 3350 (MIRALAX) packet 17 g, 1 packet, Oral, BID  sennosides-docusate sodium (SENOKOT-S) tablet 2 tablet, 2 tablet, Oral, BID  sotaloL (BETAPACE AF) tablet 160 mg, 160 mg, Oral, Q12H*  tamsulosin (FLOMAX) capsule 0.4 mg, 0.4 mg, Oral, QDAY after breakfast  warfarin (COUMADIN) tablet 5 mg, 5 mg, Oral, QHS    Continuous Infusions:      PRN and Respiratory Meds:acetaminophen Q6H PRN **OR** acetaminophen Q6H PRN, alum-mag hydroxide-simeth Q4H PRN, bisacodyL QDAY PRN, dextrose 50% (D50) IV PRN, hydrALAZINE Q6H PRN, magnesium sulfate PRN **OR** magnesium oxide PRN, melatonin QHS PRN, milk of magnesium QDAY PRN, ondansetron Q6H PRN **OR** ondansetron (ZOFRAN) IV Q6H PRN, oxyCODONE Q4H PRN, potassium chloride PRN **OR** potassium chloride PRN **OR** potassium chloride in water PRN                       Vital Signs: Last Filed                  Vital Signs: 24 Hour Range   BP: 121/61 (11/10 1200)  Temp: 36.9 ?C (98.4 ?F) (11/10 1200)  Pulse: 71 (11/10 1200)  Respirations: 18 PER MINUTE (11/10 1200)  SpO2: 91 % (11/10 1200)  O2 Device: None (Room air) (11/10 1200) BP: (93-121)/(44-66)   Temp:  [36.5 ?C (97.7 ?F)-37.2 ?C (99 ?F)]   Pulse:  [64-89]   Respirations:  [16 PER MINUTE-20 PER MINUTE]   SpO2:  [91 %-95 %]   O2 Device: None (Room air)   Intensity Pain Scale (Self Report): 5 (07/27/23 2003) Vitals:    07/26/23 0500 07/27/23 0310 07/28/23 0500   Weight: 95.3 kg (210 lb) 93.2 kg (205 lb 8 oz) 92.1 kg (203 lb 2 oz)           Intake/Output Summary:  (Last 24 hours)    Intake/Output Summary (Last 24 hours) at 07/28/2023 1505  Last data filed at 07/28/2023 0930  Gross per 24 hour   Intake 1150 ml   Output 1150 ml   Net 0 ml         Physical Exam:         Neuro: A&O x4  Cardiovascular: RRR   Respiratory: LS CTA bilaterally - diminished bases  GI: soft, NT, active BS  Extremities: 1+ BLE Edema  Incisions: Sternal incision dressing, dry and intact. No crepitus or sternal instability.        Pertinent Meds:               Taking           Reason for Not Taking  1. Aspirin Yes     2. B-Blocker Yes - Sotalol    3. Statin Yes     4. ACE/ARB No No: No Indication (LVEF >40%)         LABS:  Recent Labs     07/25/23  2209 07/26/23  0400 07/26/23  1437 07/27/23  0359 07/28/23  0640   NA  --  136*  --  136* 137   K 4.3 4.2 4.1 4.1 3.9   CL  --  101  --  102 103   CO2  --  28  --  25 25   GAP  --  7  --  9 9   BUN  --  20  --  14 17   CR  --  0.61  --  0.65 0.76   GLU  --  145*  --  145* 143*   CA  --  8.3*  --  8.1* 8.4*   MG 1.9 2.1 2.0 2.0 2.1       Recent Labs     07/26/23  0400 07/27/23  0359 07/28/23  0640   WBC 5.9 5.5  --    HGB 8.0* 8.2*  --    HCT 23.6* 24.6*  --    PLTCT 135* 181  --    PT 12.3 12.7 13.3*   INR 1.1 1.2 1.2      Estimated Creatinine Clearance: 98.9 mL/min (based on SCr of 0.76 mg/dL).  Vitals:    07/26/23 0500 07/27/23 0310 07/28/23 0500   Weight: 95.3 kg (210 lb) 93.2 kg (205 lb 8 oz) 92.1 kg (203 lb 2 oz)      No results for input(s): PHART, PO2ART in the last 72 hours.    Invalid input(s): PC02A          Radiology and Other Diagnostic Procedures Review:    Reviewed

## 2023-07-28 NOTE — Progress Notes
Electrophysiology Progress Note      Admission Date: 07/22/2023  Today's Date: 07/28/2023  LOS: 6 days    Assessment & Plan   Darius Cherry is a 72 y.o. male patient with the following problems:    Principal Problem:    S/P CABG x 3  Active Problems:    Hyperlipemia    Coronary artery disease involving native coronary artery of native heart    Overweight (BMI 25.0-29.9)    Essential hypertension    Type 2 diabetes mellitus without complication, without long-term current use of insulin (HCC)    Paroxysmal atrial fibrillation (HCC)    S/P AVR (aortic valve replacement)    Acute blood loss anemia    Anticoagulated on warfarin    Atrial fibrillation status post cardioversion [facility])    Alcohol abuse with withdrawal (HCC)    S/P left atrial appendage ligation    Pleural effusion, bilateral    Hypocalcemia    Physical deconditioning    Acute on chronic diastolic CHF (congestive heart failure), NYHA class 2 (HCC)    Atherosclerosis of aorta (HCC)      Assessment:  #Atrial Fibrillation s/p DCCV, CHADS-VASc 3  #S/p LAA Ligation on 11/6  Previously diagnosed with atrial fibrillation in 2023.  At that time, underwent cardioversion and has since been maintained on sotalol and Eliquis.  Currently admitted after undergoing CABG/AVR, with 2 unsuccessful cardioversions on 11/6.  TTE notable for severely dilated LA, patient asymptomatic.     Recommendations:  - Continue increased dose of sotalol to 160 mg BID               - QTc on 11/8 5:44AM:  - QTc post increased sotalol dose (11/8 10:30PM):  - EKG #2 (11/9 1:47PM): QTc  - EKG #3 (11/10 12:36AM): QTc  - No longer need TEE/CV now that patient is in sinus rhythm  -Currently on warfarin for anticoagulation per cardiothoracic surgery.  Note that INR is at the subtherapeutic and need to be therapeutic prior to TEE/cardioversion on Monday.    Worthy Keeler, DO   Electrophysiology Fellow         Subjective:    He states he feels overall well with no acute complaints at this time.  He denies any chest pain, shortness of breath, lightheadedness, dizziness or palpitations.  He continues to reiterate he does not know when he is in atrial fibrillation or in sinus rhythm.  He is happy to hear that he is currently in sinus rhythm and converted yesterday and without need for cardioversion.    Medications  Scheduled Meds:aspirin chewable tablet 81 mg, 81 mg, Oral, QDAY  atorvastatin (LIPITOR) tablet 80 mg, 80 mg, Oral, QHS  heparin (porcine) PF syringe 5,000 Units, 5,000 Units, Subcutaneous, Q8H  insulin aspart (U-100) (NOVOLOG FLEXPEN U-100 INSULIN) injection PEN 0-12 Units, 0-12 Units, Subcutaneous, 5 X Daily  insulin aspart (U-100) (NOVOLOG FLEXPEN U-100 INSULIN) injection PEN 6 Units, 6 Units, Subcutaneous, TID w/ meals  insulin glargine (LANTUS SOLOSTAR U-100 INSULIN) injection PEN 12 Units, 12 Units, Subcutaneous, QDAY(12)  lidocaine (LIDODERM) 5 % topical patch 1 patch, 1 patch, Topical, QDAY  metFORMIN (GLUCOPHAGE) tablet 500 mg, 500 mg, Oral, BID w/meals  methocarbamoL (ROBAXIN) tablet 750 mg, 750 mg, Oral, Q8H  polyethylene glycol 3350 (MIRALAX) packet 17 g, 1 packet, Oral, BID  sennosides-docusate sodium (SENOKOT-S) tablet 2 tablet, 2 tablet, Oral, BID  sotaloL (BETAPACE AF) tablet 160 mg, 160 mg, Oral, Q12H*  tamsulosin (FLOMAX)  capsule 0.4 mg, 0.4 mg, Oral, QDAY after breakfast  warfarin (COUMADIN) tablet 5 mg, 5 mg, Oral, QHS    Continuous Infusions:  PRN and Respiratory Meds:acetaminophen Q6H PRN **OR** acetaminophen Q6H PRN, alum-mag hydroxide-simeth Q4H PRN, bisacodyL QDAY PRN, dextrose 50% (D50) IV PRN, hydrALAZINE Q6H PRN, LORazepam  (ATIVAN)  injection Q6H PRN, magnesium sulfate PRN **OR** magnesium oxide PRN, melatonin QHS PRN, milk of magnesium QDAY PRN, ondansetron Q6H PRN **OR** ondansetron (ZOFRAN) IV Q6H PRN, oxyCODONE Q4H PRN, potassium chloride PRN **OR** potassium chloride PRN **OR** potassium chloride in water PRN      Objective Vital Signs: Last Filed                 Vital Signs: 24 Hour Range   BP: 113/57 (11/10 0445)  Temp: 36.7 ?C (98.1 ?F) (11/10 9323)  Pulse: 73 (11/09 2235)  Respirations: 18 PER MINUTE (11/10 0445)  SpO2: 95 % (11/10 0445)  O2 Device: None (Room air) (11/10 0445) BP: (93-117)/(44-67)   Temp:  [36.5 ?C (97.7 ?F)-37.3 ?C (99.1 ?F)]   Pulse:  [73-89]   Respirations:  [16 PER MINUTE-20 PER MINUTE]   SpO2:  [92 %-97 %]   O2 Device: None (Room air)   Intensity Pain Scale (Self Report): 5 (07/27/23 2003) Vitals:    07/26/23 0500 07/27/23 0310 07/28/23 0500   Weight: 95.3 kg (210 lb) 93.2 kg (205 lb 8 oz) 92.1 kg (203 lb 2 oz)         Intake/Output Summary:  (Last 24 hours)    Intake/Output Summary (Last 24 hours) at 07/28/2023 0715  Last data filed at 07/28/2023 0635  Gross per 24 hour   Intake 1400 ml   Output 1750 ml   Net -350 ml           Body mass index is 28.33 kg/m?Marland Kitchen    Physical Exam        GEN: no acute distress  HEENT: nontraumatic, MM-moist, neck supple, no JVD  CHEST: clear to auscultation bilaterally  HEART: reg rhythm, nml rate; nml S1 & S2, no S3 or S4; no rub; no murmurs  ABD: soft, nontender, BS+  EXT: no c/c/e  NEURO: A&Ox3  SKIN: warm and dry    Lab Review  Hematology:    Lab Results   Component Value Date    HGB 8.2 07/27/2023    HCT 24.6 07/27/2023    PLTCT 181 07/27/2023    WBC 5.5 07/27/2023    NEUT 64.2 06/05/2023    ANC 3.40 06/05/2023    ALC 1.16 06/05/2023    MONA 8.1 06/05/2023    AMC 0.43 06/05/2023    EOSA 1 10/22/2005    ABC 0.05 06/05/2023    MCV 93.8 07/27/2023    MCH 31.4 07/27/2023    MCHC 33.4 07/27/2023    MPV 9.6 07/27/2023    RDW 13.7 07/27/2023   , Coagulation:    Lab Results   Component Value Date    PT 12.7 07/27/2023    PTT 27.9 07/22/2023    INR 1.2 07/27/2023   , and General Chemistry:    Lab Results   Component Value Date    NA 136 07/27/2023    K 4.1 07/27/2023    CL 102 07/27/2023    CO2 25 07/27/2023    GAP 9 07/27/2023    BUN 14 07/27/2023    CR 0.65 07/27/2023    GLU 145 07/27/2023    GLU 140 10/23/2005    CA  8.1 07/27/2023    ALBUMIN 4.0 07/19/2023    MG 2.0 07/27/2023    TOTBILI 0.8 07/19/2023    PO4 2.6 10/22/2005         Telemetry- Sinus rhythm, HR 60-70s, frequent PACs, converted from atrial fibrillation to sinus rhythm around noon on 07/27/2023

## 2023-07-28 NOTE — Progress Notes
Patient seen and examined. Formal note to follow.      POD6 CABG, tissue AVR, LAAL     He converted to sinus rhythm overnight with occasional PAC's. He is hemodynamically stable on room air. He is now ambulating an entire lap around unit. He remains subtherapeutic on coumadin with INR 1.2. CXR with unchanged small-moderate left pleural effusion.     We will cancel the TEE/cardioversion for now. We will continue aspirin, statin, and coumadin for A Fib and consider switching to eliquis tomorrow as he is still subtherapeutic on coumadin. He appears largely asymptomatic with regards to his left-sided effusion so we will continue observation and gentle diuresis for now.      Joyce Gross, MD

## 2023-07-29 ENCOUNTER — Inpatient Hospital Stay: Admit: 2023-07-29 | Discharge: 2023-07-29 | Payer: MEDICARE

## 2023-07-29 ENCOUNTER — Encounter: Admit: 2023-07-29 | Discharge: 2023-07-29 | Payer: MEDICARE

## 2023-07-29 LAB — CBC
~~LOC~~ BKR MCHC: 33 g/dL (ref 32.0–36.0)
~~LOC~~ BKR MCV: 94 fL — ABNORMAL HIGH (ref 80.0–100.0)
~~LOC~~ BKR MPV: 8.8 fL (ref 7.0–11.0)
~~LOC~~ BKR RDW: 13 % (ref 11.0–15.0)

## 2023-07-29 LAB — ECG 12-LEAD
P AXIS: 70 degrees
P-R INTERVAL: 182 ms
Q-T INTERVAL: 440 ms
QRS DURATION: 102 ms
QTC CALCULATION (BAZETT): 478 ms
R AXIS: 20 degrees
T AXIS: -2 degrees
VENTRICULAR RATE: 71 {beats}/min

## 2023-07-29 LAB — POC GLUCOSE
~~LOC~~ BKR POC GLUCOSE: 162 mg/dL — ABNORMAL HIGH (ref 70–100)
~~LOC~~ BKR POC GLUCOSE: 150 mg/dL — ABNORMAL HIGH (ref 70–100)
~~LOC~~ BKR POC GLUCOSE: 159 mg/dL — ABNORMAL HIGH (ref 70–100)
~~LOC~~ BKR POC GLUCOSE: 198 mg/dL — ABNORMAL HIGH (ref 70–100)

## 2023-07-29 MED ORDER — FUROSEMIDE 40 MG PO TAB
40 mg | ORAL_TABLET | Freq: Every morning | ORAL | 0 refills | 90.00000 days | Status: AC
Start: 2023-07-29 — End: ?
  Filled 2023-07-30: qty 7, 7d supply, fill #1

## 2023-07-29 MED ORDER — SOTALOL 160 MG PO TAB
160 mg | ORAL_TABLET | Freq: Two times a day (BID) | ORAL | 3 refills | 30.00000 days | Status: AC
Start: 2023-07-29 — End: ?
  Filled 2023-07-30: qty 60, 30d supply, fill #1

## 2023-07-29 MED ORDER — SENNOSIDES-DOCUSATE SODIUM 8.6-50 MG PO TAB
2 | Freq: Two times a day (BID) | ORAL | 0 refills | Status: AC
Start: 2023-07-29 — End: ?

## 2023-07-29 MED ORDER — POTASSIUM CHLORIDE 20 MEQ PO TBTQ
20 meq | ORAL_TABLET | Freq: Every day | ORAL | 0 refills | 30.00000 days | Status: AC
Start: 2023-07-29 — End: ?
  Filled 2023-07-30: qty 7, 7d supply, fill #1

## 2023-07-29 MED ORDER — ASPIRIN 81 MG PO CHEW
81 mg | Freq: Every day | ORAL | 0 refills | Status: AC
Start: 2023-07-29 — End: ?

## 2023-07-29 MED ORDER — POLYETHYLENE GLYCOL 3350 17 GRAM PO PWPK
17 g | Freq: Two times a day (BID) | ORAL | 0 refills | 22.00000 days | Status: AC
Start: 2023-07-29 — End: ?

## 2023-07-29 MED ORDER — WARFARIN 6 MG PO TAB
6 mg | Freq: Every evening | ORAL | 0 refills | Status: DC
Start: 2023-07-29 — End: 2023-07-30

## 2023-07-29 MED ORDER — OXYCODONE-ACETAMINOPHEN 5-325 MG PO TAB
1-2 | ORAL_TABLET | ORAL | 0 refills | 2.00000 days | Status: AC | PRN
Start: 2023-07-29 — End: ?
  Filled 2023-07-30: qty 30, 5d supply, fill #1

## 2023-07-29 NOTE — Progress Notes
PHYSICAL THERAPY  PROGRESS/DISCHARGE NOTE          Name: Darius Cherry   MRN: [id number]     DOB: Jan 31, 1951      Age: 72 y.o.  Admission Date: 07/22/2023     LOS: 7 days     Date of Service: 07/29/2023        Mobility  Patient Turn/Position: Chair  Progressive Mobility Level: Walk laps  Distance Walked (feet): 400 ft  Level of Assistance: Stand by assistance  Assistive Device: None  Activity Limited By: Fatigue    Subjective  Significant Hospital Events: s/p CABGx3 and RVR 11/04  Mental / Cognitive: Alert;Oriented;Cooperative;Follows commands  Pain: No complaint of pain  Pain Interventions: Patient assisted into position of comfort    Home Living Situation  Lives With: Spouse/significant other  Type of Home: House  Entry Stairs: 2  In-Home Stairs: Able to live on main level  Bathroom Setup: Tub/shower unit  Patient Owned Equipment: Environmental consultant with wheels  Comments: Reports he owns a walker, but was not using prior to admission.    Prior Level of Function  Level Of Independence: Independent with ADL and community mobility without device  History of Falls in Past 3 Months: No    Precautions  Precautions: Sternal precautions    Bed Mobility/Transfer  Bed Mobility: Sit to Supine: Standby Assist    Transfer Type: Sit to/from Stand  Transfer: Assistance Level: To/From;Bed Side Chair;Standby Assist  Transfer: Assistive Device: None  Transfers: Type Of Assistance: For Safety Considerations    End Of Activity Status: In Bed;Nursing Notified;Instructed Patient to Request Assist with Mobility;Instructed Patient to Use Call Light    Gait  Gait Distance: 400 feet  Gait: Assistance Level: Standby Assist  Gait: Assistive Device: None  Gait: Descriptors: Pace: Slow;No balance loss  Activity Limited By: Complaint of Fatigue    Education  Persons Educated: Patient  Patient Barriers To Learning: None Noted  Teaching Methods: Verbal Instruction  Patient Response: Verbalized Understanding  Topics: Plan/Goals of PT Interventions;Use of Assistive Device/Orthosis;Mobility Progression;Precautions;Up with Assist Only;Importance of Increasing Activity    Assessment/Progress  Assessment/Progress: Discontinue PT    AM-PAC 6 Clicks Basic Mobility Inpatient  Turning from your back to your side while in a flat bed without using bed rails: None  Moving from lying on your back to sitting on the side of a flat bed without using bedrails : None  Moving to and from a bed to a chair (including a wheelchair): None  Standing up from a chair using your arms (e.g. wheelchair, or bedside chair): None  To walk in hospital room: None  Climbing 3-5 steps with a railing: A Little  Basic Mobility Inpatient Raw Score: 23  Standardized (T-scale) Score: 50.88    Goals  Goal Formulation: With Patient  Time For Goal Achievement: 3 days, To, 5 days  Patient Will Go Supine To/From Sit: w/ Stand By Assist, Met  Patient Will Transfer Bed/Chair: w/ Stand By Assist, Met  Patient Will Ambulate: Greater than 200 Feet, w/ Walker, w/ Stand By Assist, Met  Patient Will Go Up / Down Stairs: 1-2 Stairs, w/ Stand By Assist, Discontinued    Plan  Plan Frequency: No Further Treatment    PT Discharge Recommendations  Recommendation: Home with intermittent supervision/assistance  Patient Currently Requires Equipment: Owns what is needed    Therapist: Harriett Rush, PT, DPT  Date: 07/29/2023

## 2023-07-29 NOTE — Progress Notes
07/29/23 0945   Cardiac Rehab Activity   Distance Walked (feet) 460 ft   Ambulation Goal Ad lib ambulation   Patient Position Sitting   BP Pre-activity 112/56   BP Post-activity 119/64   HR Pre-activity 74 bpm   HR Post-activity 73   SaO2 Pre-activity 96 %   SaO2 Post-activity 95   O2 Device None (Room Air)   Comments Pt ambulated 460 feet with SBA, no walker, and no rest breaks.  No symptoms noted. Pt returned to chair in room post walk.   Mobility   Progressive Mobility Level 9   Level of Assistance Stand by assistance   Assistive Device None   Time Tolerated 0-10 minutes   Activity Limited By No limitations

## 2023-07-29 NOTE — Progress Notes
Endocrinology Progress Note    Darius Cherry  Date of Admission: 07/22/2023    Impression:  A 72 yr old male with history of type 2 DM and CAD, hypertension, paroxysmal atrial fibrillation who underwent CABG x 3 and aortic valve replacement on 07/22/2023.      Type 2 Diabetes Mellitus, uncontrolled  Steroid-induced hyperglycemia     Diabetes History:  Dx: > 10 years ago  Outpatient Regimen:  Rybelsus 14mg  tablet daily, metformin XR 500mg  bid, Jardiance 25mg  daily  Outpatient provider:  PCP, Dr. Wilford Grist  A1c:  7.4% (06/10/2023)  prior A1c 9.5% in 04/2022     Coronary artery disease  Hypertension  Dyslipidemia      Diet: carb consistent       Recommendations/Plan:  Continue Lantus 12 units daily   Continue Novolog 6 units tid with meals, hold if NPO or not eating  Continue Novolog Mid-dose correction factor   Metformin 500 mg BID  Hold outpatient rybelsus and jardiance but checking with primary team if this can be restarted as outpatient.      Discharge planning:  - final regimen TBD  - he may or may not need new insulin at discharge; hopefully he can resume home medications  - Had discussion on transitioning from Rybelsus to injectable GLP-1 but he would prefer oral agent  - plan to have him f/u locally with PCP    __________________________________________________________________________________  Subjective:  Darius Cherry is a 72 y.o. male. No acute events overnight. No hypoglycemic episodes. He is feeling well and wondering if he can be discharged home today.    On ROS: no chest pain, SOB.       Objective:                       Vital Signs: Last Filed                 Vital Signs: 24 Hour Range   BP: 108/44 (11/11 0736)  Temp: 37.2 ?C (98.9 ?F) (11/11 0736)  Pulse: 68 (11/11 0350)  Respirations: 16 PER MINUTE (11/11 0736)  SpO2: 97 % (11/11 0736)  O2 Device: None (Room air) (11/11 0736) BP: (102-121)/(40-61)   Temp:  [36.8 ?C (98.2 ?F)-37.3 ?C (99.2 ?F)]   Pulse:  [68-76]   Respirations:  [16 PER MINUTE-18 PER MINUTE]   SpO2:  [91 %-97 %]   O2 Device: None (Room air)      Intake/Output Summary (Last 24 hours) at 07/29/2023 0752  Last data filed at 07/29/2023 0350  Gross per 24 hour   Intake 450 ml   Output 1600 ml   Net -1150 ml         Physical Examination:  GEN: Alert, oriented, no apparent distress  HEENT: mildly pale, no jaundice  RESP: breathing comfortably  EXT: No edema      Labs:  Recent Labs     07/26/23  1437 07/27/23  0359 07/28/23  0640 07/29/23  0442   NA  --  136* 137 138   K 4.1 4.1 3.9 4.1   CL  --  102 103 104   CO2  --  25 25 27    GAP  --  9 9 7    BUN  --  14 17 15    CR  --  0.65 0.76 0.75   GLU  --  145* 143* 143*   CA  --  8.1* 8.4* 8.7   MG 2.0 2.0 2.1  1.9       Recent Labs     07/27/23  0359 07/28/23  0640 07/29/23  0442   WBC 5.5  --  5.8   HGB 8.2*  --  8.3*   HCT 24.6*  --  24.4*   PLTCT 181  --  249   PT 12.7 13.3* 14.6*   INR 1.2 1.2 1.3*     Lipid Profile:   Lab Results   Component Value Date    CHOL 138 06/10/2023    TRIG 69 06/10/2023    HDL 55 06/10/2023    LDL 76 06/10/2023    VLDL 14 06/10/2023      Estimated Creatinine Clearance: 100 mL/min (based on SCr of 0.75 mg/dL).  Vitals:    07/27/23 0310 07/28/23 0500 07/29/23 0500   Weight: 93.2 kg (205 lb 8 oz) 92.1 kg (203 lb 2 oz) 91.5 kg (201 lb 12.8 oz)    No results for input(s): PHART, PO2ART in the last 72 hours.    Invalid input(s): PC02A      Thyroid Studies    Lab Results   Component Value Date/Time    TSH 3.17 12/11/2021 12:00 AM    No results found for: FREET3, T3BINDING, THYBINDGLB                    Medsaspirin, 81 mg, Oral, QDAY  atorvastatin, 80 mg, Oral, QHS  heparin (porcine), 5,000 Units, Subcutaneous, Q8H  insulin aspart (NovoLOG) injection, 0-12 Units, Subcutaneous, 5 X Daily  insulin aspart (NovoLOG) injection, 6 Units, Subcutaneous, TID w/ meals  insulin glargine (LANTUS) injection, 12 Units, Subcutaneous, QDAY(12)  lidocaine, 1 patch, Topical, QDAY  metFORMIN, 500 mg, Oral, BID w/meals  methocarbamoL, 750 mg, Oral, Q8H  polyethylene glycol 3350, 1 packet, Oral, BID  sennosides-docusate sodium, 2 tablet, Oral, BID  sotaloL, 160 mg, Oral, Q12H*  tamsulosin, 0.4 mg, Oral, QDAY after breakfast  warfarin, 5 mg, Oral, QHS     IV MEDS  Prnacetaminophen Q6H PRN **OR** acetaminophen Q6H PRN, alum-mag hydroxide-simeth Q4H PRN, bisacodyL QDAY PRN, dextrose 50% (D50) IV PRN, hydrALAZINE Q6H PRN, magnesium sulfate PRN **OR** magnesium oxide PRN, melatonin QHS PRN, milk of magnesium QDAY PRN, ondansetron Q6H PRN **OR** ondansetron (ZOFRAN) IV Q6H PRN, oxyCODONE Q4H PRN, potassium chloride PRN **OR** potassium chloride PRN **OR** potassium chloride in water PRN     Konrad Felix, MD  Pager # [phone]  07/29/2023

## 2023-07-29 NOTE — Progress Notes
Cardiology Plan of Care    ECG reviewed post dose 3 of sotalol 160 mg. ECG shows sinus rhythm with atrial bigeminy with HR 70 bpm.  QT interval is measured in lead II at 428.  QTc is 462.  Previous QTc is 480.  Current QTc appears to be stable and is within acceptable limits.     Ok to continue the current dose of sotalol      Burman Nieves, DO   Cardiovascular Disease Fellow

## 2023-07-29 NOTE — Progress Notes
Cardiothoracic Surgery Daily Progress Note     Darius Cherry  [facility] Date:  07/29/2023  Admission Date: 07/22/2023  LOS: 7 days    07/22/23 -   Procedure(s):  Coronary artery bypass grafting x3, left internal mammary artery take down, endoscopic vein harvest, Aortic Valve Replacement- tissue, left atrial appendage ligation.    POD#: 7    Principal Problem:    S/P CABG x 3  Active Problems:    Hyperlipemia    Coronary artery disease involving native coronary artery of native heart    Overweight (BMI 25.0-29.9)    Essential hypertension    Type 2 diabetes mellitus without complication, without long-term current use of insulin (HCC)    Paroxysmal atrial fibrillation (HCC)    S/P AVR (aortic valve replacement)    Acute blood loss anemia    Anticoagulated on warfarin    Atrial fibrillation status post cardioversion [facility])    Alcohol abuse with withdrawal (HCC)    S/P left atrial appendage ligation    Pleural effusion, bilateral    Hypocalcemia    Acute on chronic diastolic CHF (congestive heart failure), NYHA class 2 (HCC)    Atherosclerosis of aorta (HCC)    Hypokalemia    Subjective: Denies complaints.    Overnight Events:  No acute events.    Assessment/Plan:      Neuro/Psych - Pain controlled. Continue PRN oxycodone, Tylenol, robaxin & Lidoderm patches. Hx of daily ETOH use (6 drinks daily, last on 10/31).  Mild agitation 11/7, with diaphoresis, tachycardia, borderline febrile. Suspect some withdrawal, responded well to 0.25mg  IV ativan. Continue Q6hr PRN for s/s of withdrawal. Has been somewhat somnolent overall since surgery. Pta pain meds: APAP. Pta psych meds: None. No h/o known cerebrovascular disease.     CV - SR--> AFib ~ 0600 on 11/6. Attempted DCCV x2 on 11/6, unsuccessful. Converted to NSR/ PACs/iRBBB 11/9.  Currently 60-70s. Continue PTA sotalol. Consulted EP for recommendations regarding sotalol dosing and overall rhythm management. Per EP plan for TEE/cardioversion on 11/11 if necessary, cancelled. SBP 100-120s. Cont ASA, statin. No Amio ppx with sotalol. Hold BB for hypotension.  Takes coreg 3.125 at home. Post-procedure TEE LVEF 55-60%. +acute on chronic diastolic heart failure, NYHA class II. Atherosclerosis of aorta: yes. Pulmonary hypertension: No.  NT-Pro-BNP   Date/Time Value Ref Range Status   07/19/2023 02:21 PM 497.0 (H) <125 pg/mL Final     Comment:     NOTE: Normal reference ranges for NT-proBNP vary by age:  <125 pg/mL for individuals < 80 years old  <450 pg/mL for individuals =/> 28 years old        Resp - SpO2 91-95% on RA. Daily CXR: left pleural effusion. Cont IS, aggressive pulm toilet. Chest tubes removed. COPD: No. Asthma: No. OSA: No.  Lab Results   Component Value Date    FVCPRE 3.10 06/10/2023    FVCPREDPRE 75 06/10/2023    FEV1PRE 2.51 06/10/2023    FEV1PREDPRE 82 06/10/2023     Renal/FEN - Baseline sCr. 0.9. Today sCr 0.75. No h/o CKD. UOP: 1.6 L/24hr, , net -1.1 L/24hr, overall -1.8 L/admission. Weight: -0.1 kg/admission. Monitor and replace lytes PRN per protocol to minimize risk of cardiac arrhythmias. Has not received diuresis post op d/t low filling pressures, continues to auto-diurese. Consider lasix today, discuss with staff. Pta electrolytes/ diuretics: None. BPH Meds: Cont PTA flomax.  Discontinued foley catheter 11/8.    GI - Cont cardiac/ ADA diet. Last BM: 11/10, continue post op bowel  regimen. S/p Relistor 11/6, repeated 11/7. No known chronic liver dysfunction. +Daily EtOH use. Obesity: no. Body mass index is 28.15 kg/m?.    ID - Afebrile. WBC 5.8. Monitor for s/s infection. Currently holding pta augmentin for ingrown toenail.    Heme - Hgb pending. Monitor acute blood loss anemia. Thrombocytopenia: resolved. Coagulopathy: No. Antiplatelet: No. Anticoagulation: Cont SQ heparin and mechanical DVT prophylaxis. Holding PTA Eliquis (pAF), started on Warfarin 11/5 (tissue valve), continue QHS. Daily INR - 1.3 today. Will need INR management arranged. Hold off on Heparin bridge for AFib per CTS, s/p left atrial appendage ligation, ongoing discussion.     Endo -  A1c 7.4%, FSBS 143-162. Endocrine following - appreciate assistance. Cont MDCF, aspart 6, & Lantus 12. Cont pta metformin 500 mg BID. Hold pta jardiance  and rybelsus for now. No h/o thyroid disorder.     Activity - Pt up to chair today, should ambulate in halls with nursing and cardiac rehab TID.  Ambulated 360' yesterday. Anticipate dc home w assist when medically stable. Cont to increase as tolerated. Age related physical debility: yes. Chronic fatigue: No.    Disposition: Cont sotalol per EP. Converted to SR, TEE/Cardioversion cancelled. Auto-diuresing, L effusion on CXR - discuss diuresis with staff. Cont new coumadin for valve ppx/ atrial fibrillation. Will need INR management arranged (follows w Dr. Barry Dienes). Appreciate Endocrinology input. Cont to increase activity. DC 1-2 days.      Prophylaxis Review:  Lines:  No  Antibiotic Usage:  No  VTE: Pharmacological prophylaxis: SQ heparin and Warfarin and Mechanical prophylaxis: TED hose  Urinary Catheter: No  Temporary Pacing Wires Present?: No Date Removed: 11/7    Cathie Hoops, PA-C   Cardiothoracic Surgery  Available via voalte or Pager [phone]  07/29/2023    Subjective:       HPI:   Darius Cherry is a 72 y.o. male whose current medical conditions include CAD s/p PCI to circumflex in 2007 and then PCI to circumflex and LAD in 2013, paroxysmal afib on apixaban, HTN, HLD, DM2, severe aortic stenosis (follows with Dr. Barry Dienes). Referred to [facility] valve clinic. Found to have CAD. He is now s/p CABG/AVR (t) with Dr. Sindy Messing.      11/5: wean norepi, dc PAC, possible tele   11/6: Afib, low BP, +volume and pressor, resume sotalol, relistor, pain control, failed DCCV x2  11/7: likely withdrawal, +PRN ativan, DC wires/CTs, spot diuresis, repeat relistor, no heparin bridge per CTS  11/8: Afib, Consult EP, Dc foley, start SQ heparin    Review of Systems   Constitutional:  Negative for chills and fever.   Eyes:  Negative for blurred vision and double vision.   Respiratory:  Negative for cough and shortness of breath.    Cardiovascular:  Negative for chest pain, palpitations and leg swelling.        Positive for left sided chest pain related to chest tube    Gastrointestinal:  Negative for abdominal pain, nausea and vomiting.   Neurological:  Negative for dizziness, tremors, sensory change, speech change, focal weakness and headaches.   Psychiatric/Behavioral:  The patient is not nervous/anxious.           Objective:        Medications:  Scheduled Meds:aspirin chewable tablet 81 mg, 81 mg, Oral, QDAY  atorvastatin (LIPITOR) tablet 80 mg, 80 mg, Oral, QHS  heparin (porcine) PF syringe 5,000 Units, 5,000 Units, Subcutaneous, Q8H  insulin aspart (U-100) (NOVOLOG FLEXPEN U-100 INSULIN) injection PEN  0-12 Units, 0-12 Units, Subcutaneous, 5 X Daily  insulin aspart (U-100) (NOVOLOG FLEXPEN U-100 INSULIN) injection PEN 6 Units, 6 Units, Subcutaneous, TID w/ meals  insulin glargine (LANTUS SOLOSTAR U-100 INSULIN) injection PEN 12 Units, 12 Units, Subcutaneous, QDAY(12)  lidocaine (LIDODERM) 5 % topical patch 1 patch, 1 patch, Topical, QDAY  metFORMIN (GLUCOPHAGE) tablet 500 mg, 500 mg, Oral, BID w/meals  methocarbamoL (ROBAXIN) tablet 750 mg, 750 mg, Oral, Q8H  polyethylene glycol 3350 (MIRALAX) packet 17 g, 1 packet, Oral, BID  sennosides-docusate sodium (SENOKOT-S) tablet 2 tablet, 2 tablet, Oral, BID  sotaloL (BETAPACE AF) tablet 160 mg, 160 mg, Oral, Q12H*  tamsulosin (FLOMAX) capsule 0.4 mg, 0.4 mg, Oral, QDAY after breakfast  warfarin (COUMADIN) tablet 5 mg, 5 mg, Oral, QHS    Continuous Infusions:      PRN and Respiratory Meds:acetaminophen Q6H PRN **OR** acetaminophen Q6H PRN, alum-mag hydroxide-simeth Q4H PRN, bisacodyL QDAY PRN, dextrose 50% (D50) IV PRN, hydrALAZINE Q6H PRN, magnesium sulfate PRN **OR** magnesium oxide PRN, melatonin QHS PRN, milk of magnesium QDAY PRN, ondansetron Q6H PRN **OR** ondansetron (ZOFRAN) IV Q6H PRN, oxyCODONE Q4H PRN, potassium chloride PRN **OR** potassium chloride PRN **OR** potassium chloride in water PRN                       Vital Signs: Last Filed                  Vital Signs: 24 Hour Range   BP: 117/55 (11/11 0350)  Temp: 37.2 ?C (98.9 ?F) (11/11 0350)  Pulse: 68 (11/11 0350)  Respirations: 16 PER MINUTE (11/11 0350)  SpO2: 93 % (11/11 0350)  O2 Device: None (Room air) (11/11 0350) BP: (102-121)/(40-61)   Temp:  [36.8 ?C (98.2 ?F)-37.3 ?C (99.2 ?F)]   Pulse:  [64-76]   Respirations:  [16 PER MINUTE-18 PER MINUTE]   SpO2:  [91 %-95 %]   O2 Device: None (Room air)     Vitals:    07/27/23 0310 07/28/23 0500 07/29/23 0500   Weight: 93.2 kg (205 lb 8 oz) 92.1 kg (203 lb 2 oz) 91.5 kg (201 lb 12.8 oz)           Intake/Output Summary:  (Last 24 hours)    Intake/Output Summary (Last 24 hours) at 07/29/2023 2440  Last data filed at 07/29/2023 0350  Gross per 24 hour   Intake 450 ml   Output 1600 ml   Net -1150 ml         Physical Exam:         Neuro: A&O x4  Cardiovascular: RRR   Respiratory: LS CTA bilaterally - diminished bases  GI: soft, NT, active BS  Extremities: 1+ BLE Edema  Incisions: Sternal incision dressing, dry and intact. No crepitus or sternal instability.        Pertinent Meds:               Taking           Reason for Not Taking  1. Aspirin Yes     2. B-Blocker Yes - Sotalol    3. Statin Yes     4. ACE/ARB No No: No Indication (LVEF >40%)         LABS:  Recent Labs     07/26/23  1437 07/27/23  0359 07/28/23  0640 07/29/23  0442   NA  --  136* 137 138   K 4.1 4.1 3.9 4.1   CL  --  102 103 104   CO2  --  25 25 27    GAP  --  9 9 7    BUN  --  14 17 15    CR  --  0.65 0.76 0.75   GLU  --  145* 143* 143*   CA  --  8.1* 8.4* 8.7   MG 2.0 2.0 2.1 1.9       Recent Labs     07/27/23  0359 07/28/23  0640 07/29/23  0442   WBC 5.5  --  5.8   HGB 8.2*  --  8.3*   HCT 24.6*  --  24.4*   PLTCT 181  --  249   PT 12.7 13.3* 14.6*   INR 1.2 1.2 1.3*      Estimated Creatinine Clearance: 100 mL/min (based on SCr of 0.75 mg/dL).  Vitals:    07/27/23 0310 07/28/23 0500 07/29/23 0500   Weight: 93.2 kg (205 lb 8 oz) 92.1 kg (203 lb 2 oz) 91.5 kg (201 lb 12.8 oz)      No results for input(s): PHART, PO2ART in the last 72 hours.    Invalid input(s): PC02A          Radiology and Other Diagnostic Procedures Review:    Reviewed

## 2023-07-29 NOTE — Progress Notes
HC4 END OF SHIFT NURSING NOTE  Acute events, nursing interventions, and communication with providers:   Assumed care @2300   A/Ox4. VSS per trend.  Tolerating RA  Pain managed w/ current regimen  Surgical incisions C/D/I  Ambulating w SBA  Fall bundle in place. Call light within reach  UOA but inaccurate d/t missed hat. -BM this shift      Cardiac Rhythm: SR/SA c PACs    Activity:Other   UAL      Intake and Output:        Intake/Output Summary (Last 24 hours) at 07/29/2023 4403  Last data filed at 07/29/2023 0350  Gross per 24 hour   Intake 450 ml   Output 1600 ml   Net -1150 ml          Last Bowel Movement Date: 07/28/23      Patient Education  Quality/Safety Education: Fall risk  Cardiac - Specific Education: Cardiac diet/nutrition  Other Patient education provided:   Learners: Patient  The following teaching method(s) were used: Verbal teaching  Response to learning: [employer] Understanding  Needs reinforcement on:     Patient Goal(s):  Patient will Pain will be </= 4 this shift  by the end of shift         Patient will  Report progressive increase in activity tolerance  by discharge date   Other:    Restraints:  No     Restraint Goal: Patient will be free from injury while physically restrained.  See Docflowsheet for restraint documentation, interventions, education, etc.

## 2023-07-30 DIAGNOSIS — I723 Aneurysm of iliac artery: Secondary | ICD-10-CM

## 2023-07-30 DIAGNOSIS — I10 Essential (primary) hypertension: Secondary | ICD-10-CM

## 2023-07-30 DIAGNOSIS — D691 Qualitative platelet defects: Secondary | ICD-10-CM

## 2023-07-30 DIAGNOSIS — I2510 Atherosclerotic heart disease of native coronary artery without angina pectoris: Secondary | ICD-10-CM

## 2023-07-30 DIAGNOSIS — Z8249 Family history of ischemic heart disease and other diseases of the circulatory system: Secondary | ICD-10-CM

## 2023-07-30 DIAGNOSIS — R578 Other shock: Secondary | ICD-10-CM

## 2023-07-30 DIAGNOSIS — Z87891 Personal history of nicotine dependence: Secondary | ICD-10-CM

## 2023-07-30 DIAGNOSIS — I700 Atherosclerosis of aorta: Secondary | ICD-10-CM

## 2023-07-30 DIAGNOSIS — Z79899 Other long term (current) drug therapy: Secondary | ICD-10-CM

## 2023-07-30 DIAGNOSIS — I959 Hypotension, unspecified: Secondary | ICD-10-CM

## 2023-07-30 DIAGNOSIS — Z7901 Long term (current) use of anticoagulants: Secondary | ICD-10-CM

## 2023-07-30 DIAGNOSIS — I252 Old myocardial infarction: Secondary | ICD-10-CM

## 2023-07-30 DIAGNOSIS — I7143 Infrarenal abdominal aortic aneurysm, without rupture (HCC): Secondary | ICD-10-CM

## 2023-07-30 DIAGNOSIS — E876 Hypokalemia: Secondary | ICD-10-CM

## 2023-07-30 DIAGNOSIS — Z6828 Body mass index (BMI) 28.0-28.9, adult: Secondary | ICD-10-CM

## 2023-07-30 DIAGNOSIS — F1013 Alcohol abuse with withdrawal, uncomplicated (HCC): Secondary | ICD-10-CM

## 2023-07-30 DIAGNOSIS — I480 Paroxysmal atrial fibrillation: Secondary | ICD-10-CM

## 2023-07-30 DIAGNOSIS — I352 Nonrheumatic aortic (valve) stenosis with insufficiency: Secondary | ICD-10-CM

## 2023-07-30 DIAGNOSIS — D62 Acute posthemorrhagic anemia: Secondary | ICD-10-CM

## 2023-07-30 DIAGNOSIS — Z7984 Long term (current) use of oral hypoglycemic drugs: Secondary | ICD-10-CM

## 2023-07-30 DIAGNOSIS — I110 Hypertensive heart disease with heart failure: Secondary | ICD-10-CM

## 2023-07-30 DIAGNOSIS — E1165 Type 2 diabetes mellitus with hyperglycemia: Secondary | ICD-10-CM

## 2023-07-30 DIAGNOSIS — Z823 Family history of stroke: Secondary | ICD-10-CM

## 2023-07-30 DIAGNOSIS — D696 Thrombocytopenia, unspecified: Secondary | ICD-10-CM

## 2023-07-30 DIAGNOSIS — E663 Overweight: Secondary | ICD-10-CM

## 2023-07-30 DIAGNOSIS — Z833 Family history of diabetes mellitus: Secondary | ICD-10-CM

## 2023-07-30 DIAGNOSIS — I708 Atherosclerosis of other arteries: Secondary | ICD-10-CM

## 2023-07-30 DIAGNOSIS — E785 Hyperlipidemia, unspecified: Secondary | ICD-10-CM

## 2023-07-30 DIAGNOSIS — R6510 Systemic inflammatory response syndrome (SIRS) of non-infectious origin without acute organ dysfunction: Secondary | ICD-10-CM

## 2023-07-30 DIAGNOSIS — T380X5A Adverse effect of glucocorticoids and synthetic analogues, initial encounter: Secondary | ICD-10-CM

## 2023-07-30 DIAGNOSIS — Z794 Long term (current) use of insulin: Secondary | ICD-10-CM

## 2023-07-30 DIAGNOSIS — I5033 Acute on chronic diastolic (congestive) heart failure: Secondary | ICD-10-CM

## 2023-07-30 DIAGNOSIS — J9811 Atelectasis: Secondary | ICD-10-CM

## 2023-07-30 DIAGNOSIS — Z85828 Personal history of other malignant neoplasm of skin: Secondary | ICD-10-CM

## 2023-07-30 LAB — ECG 12-LEAD
P AXIS: 77 degrees
P AXIS: 49 degrees
P-R INTERVAL: 202 ms
P-R INTERVAL: 186 ms
Q-T INTERVAL: 434 ms
Q-T INTERVAL: 464 ms
QRS DURATION: 98 ms
QRS DURATION: 102 ms (ref 0.00–0.20)
QRS DURATION: 100 ms
QTC CALCULATION (BAZETT): 469 ms
QTC CALCULATION (BAZETT): 482 ms
R AXIS: 21 degrees
R AXIS: 18 degrees
T AXIS: 4 degrees
T AXIS: -7 degrees
VENTRICULAR RATE: 76 {beats}/min
VENTRICULAR RATE: 65 {beats}/min

## 2023-07-31 ENCOUNTER — Encounter: Admit: 2023-07-31 | Discharge: 2023-07-31 | Payer: MEDICARE

## 2023-08-20 ENCOUNTER — Ambulatory Visit: Admit: 2023-08-20 | Discharge: 2023-08-21 | Payer: MEDICARE

## 2023-08-20 ENCOUNTER — Encounter: Admit: 2023-08-20 | Discharge: 2023-08-20 | Payer: MEDICARE

## 2023-08-20 DIAGNOSIS — R0989 Other specified symptoms and signs involving the circulatory and respiratory systems: Secondary | ICD-10-CM

## 2023-08-20 MED ORDER — SOTALOL 160 MG PO TAB
160 mg | ORAL_TABLET | Freq: Two times a day (BID) | ORAL | 3 refills
Start: 2023-08-20 — End: ?

## 2023-08-20 NOTE — Progress Notes
Date of Service: 08/20/2023       Subjective:             Darius Cherry is a 72 y.o. male.      History of Present Illness  Darius Cherry is being seen in clinic today for his routine postoperative visit. He has a past medical history of coronary artery disease with PCI to the circumflex in 2007 and to the circumflex and LAD in 2013, paroxysmal A-fib on apixaban, hypertension, hyperlipidemia, type 2 diabetes and aortic insufficiency who underwent an aortic valve replacement, CABG x3 (LIMA-> LAD, SVG-> OM2, SVG-> left posterior lateral), and left atrial appendage ligation on 07/22/23 under the direction of Dr. Sindy Messing III. He tolerated this procedure well. Postoperatively he initially required vasopressors for hemodynamic stability. The patient had several episodes of atrial fibrillation and EP was consulted. A TEE and cardioversion was scheduled but the patient converted to sinus rhythm prior. Coumadin was initially started for his anticoagulation but later switched back to his home Eliquis. He was transferred to telemetry and his activity improved daily. He was discharged home on POD 7 with his home coreg held due to low blood pressure and a prescription for a week of diuretics.     Today he reports that his incisional pain and energy level continue to improve. He still experiences some chest pain with coughing or sneezing. He has occasionally still taken oxycodone but plans to start trying acetaminophen when he has pain. He had one episode of palpitations since being home but none since then. He denies chest pressure/discomfort, dyspnea, irregular heart beats, near-syncope, syncope, fatigue, chills and fever.    His home systolic blood pressure readings have averaged 120s and his heart rates have averaged 70-80s. His weight has remained stable since completing the week of lasix he was prescribed at discharge.     His activity level has been improving and he has been walking independently daily for approximately 30 minutes at a time without complication. He started cardiac rehab a little over a week ago.     Darius Cherry has a follow up with Dr. Barry Dienes on 09/23/22, his cardiologist and followed up with his primary care provider already.       His CXR today showed: Mildly enlarged cardiac silhouette, with decrease in size of small   bilateral pleural effusions, larger on the left. Decrease in bibasilar opacities, likely improving atelectasis. Sternal wires are intact.     Review of Systems   Constitutional: Negative.   HENT: Negative.     Eyes: Negative.    Cardiovascular: Negative.    Respiratory: Negative.     Endocrine: Negative.    Hematologic/Lymphatic: Negative.    Skin: Negative.    Musculoskeletal: Negative.    Gastrointestinal: Negative.    Genitourinary: Negative.    Neurological: Negative.    Psychiatric/Behavioral: Negative.     Allergic/Immunologic: Negative.      Objective:          acetaminophen (TYLENOL) 325 mg tablet Take two tablets by mouth every 4 hours as needed.    amoxicillin(+) (AMOXIL) 500 mg tablet 2g 1hr prior to procedure; 1gm 6hr after procedure    apixaban (ELIQUIS) 5 mg tablet Take one tablet by mouth twice daily.    aspirin 81 mg chewable tablet Chew one tablet by mouth daily. Indications: treatment to prevent a heart attack    atorvastatin (LIPITOR) 80 mg tablet Take one tablet by mouth daily. Indications: hardening of the  arteries due to plaque buildup    calcium carbonate/vitamin D-3 (OSCAL-500+D) 1250 mg/200 unit tablet Take one tablet by mouth twice daily. Calcium Carb 1250mg  delivers 500mg  elemental Ca    empagliflozin (JARDIANCE) 25 mg tablet Take one tablet by mouth every morning.    metFORMIN-XR (GLUCOPHAGE XR) 500 mg extended release tablet Take one tablet by mouth twice daily.    oxyCODONE-acetaminophen (PERCOCET) 5-325 mg tablet Take one tablet to two tablets by mouth every 6 hours as needed for Pain. Indications: pain (Patient not taking: Reported on 08/20/2023)    polyethylene glycol 3350 (MIRALAX) 17 g packet Take one packet by mouth twice daily. While taking narcotic pain medication  Indications: constipation (Patient not taking: Reported on 08/20/2023)    RYBELSUS 14 mg tablet Take one tablet by mouth daily.    sennosides-docusate sodium (SENOKOT-S) 8.6/50 mg tablet Take two tablets by mouth twice daily. While taking narcotic pain medication  Indications: constipation    sotaloL (BETAPACE) 160 mg tablet Take one tablet by mouth every 12 hours. Indications: prevention of recurrent atrial fibrillation    tamsulosin (FLOMAX) 0.4 mg capsule Take one capsule by mouth at bedtime daily.     Vitals:    08/20/23 0948   BP: 126/64   BP Source: Arm, Right Upper   Pulse: 75   SpO2: 97%   Weight: 88.5 kg (195 lb)   Height: 180.3 cm (5' 11)     Body mass index is 27.2 kg/m?Marland Kitchen     Physical Exam  Vitals reviewed.   HENT:      Head: Normocephalic.      Mouth/Throat:      Mouth: Mucous membranes are moist.   Eyes:      Pupils: Pupils are equal, round, and reactive to light.   Cardiovascular:      Rate and Rhythm: Normal rate and regular rhythm.      Heart sounds: No murmur heard.  Pulmonary:      Effort: Pulmonary effort is normal.      Breath sounds: Normal breath sounds.   Chest:      Comments: Mediastinal incision and chest tube sites healing well and well approximated without exudate, erythema, or swelling. Sternum is stable with cough.    Musculoskeletal:      Right lower leg: No edema.      Left lower leg: No edema.   Skin:     General: Skin is warm and dry.      Capillary Refill: Capillary refill takes less than 2 seconds.   Neurological:      General: No focal deficit present.      Mental Status: He is alert.   Psychiatric:         Attention and Perception: Attention normal.         Mood and Affect: Mood normal.         Speech: Speech normal.         Cognition and Memory: Cognition normal.       Assessment:  Patient was informed that at 6 weeks from the date of surgery he may gradually increase the amount of weight that he is lifting. He should start at low weights and gradually work his way up. If there is no discomfort while doing it, then that is ok. If he has discomfort then he should stop. At 3 months from the date of surgery the sternum should be completely healed. If he has any questions or concerns he is to notify our  office. He will continue to follow with his PCP and cardiologist for continued care.      Education was given to [employer] on the importance of always taking prophylactic antibiotics prior to any dental work, certain medical procedures, and any surgeries. This is to help prevent any damage to the valve from potential infection. He is to inform his ordering physician about their history of a valve repair or replacement and to follow medication instructions accordingly. This is our recommendation for the life of the valve.    We also recommend yearly echocardiograms for continued valve surveillance.      Jeneen Rinks, APRN  Thoracic and Cardiovascular Surgery  [phone]     Assessment and Plan:      I had the pleasure of seeing Darius Cherry in postop from his aortic valve and coronary bypass surgery.  He had coronary bypass x 3 with LIMA to LAD, saphenous vein graft to OM 2 and saphenous vein graft to the left posterior lateral branch.  He also had a 25 mm epic tissue valve placed.  His postoperative course was uneventful and he is continue to do very well at home.    Today in office he reports his pain is well-controlled.  He is participating in cardiac rehab and tolerating it well.  On exam his incision is healed nicely and his sternum is stable.  He does not have a significant murmur.  He has minimal peripheral edema.  Overall we are very pleased with his progress today.    We reviewed weight limitations but did say that he can resume driving.  He follows regularly with Dr. Barry Dienes for his cardiovascular care.  We are going to release him from our office but I will be happy to see him at any time if problems develop.  I appreciate the opportunity participate in his care.    Sindy Messing, M.D.

## 2023-08-26 ENCOUNTER — Encounter: Admit: 2023-08-26 | Discharge: 2023-08-26 | Payer: MEDICARE

## 2023-08-29 ENCOUNTER — Encounter: Admit: 2023-08-29 | Discharge: 2023-08-29 | Payer: MEDICARE

## 2023-09-02 ENCOUNTER — Encounter: Admit: 2023-09-02 | Discharge: 2023-09-02 | Payer: MEDICARE

## 2023-09-02 NOTE — Telephone Encounter
Desiree from Cardiac Rehab with [facility] in [place] left a message for the office that the patient had some popping noises and felt some movement during his recent therapy session. Reached out to the patient to further assess. His wife answered their listed phone number and said that he was working and could not take the call. Advised that Darius Cherry should monitor his sternum. If he has increased pain, worsening popping/movement, then he should reach out to our office for further evaluation. Otherwise, the patient should monitor. Avoid activity/motion causing popping for the next few weeks and then can gradually try to increase activity again. Patient's wife took message and office contact number for patient if he has any questions or concers.

## 2023-09-03 ENCOUNTER — Encounter: Admit: 2023-09-03 | Discharge: 2023-09-03 | Payer: MEDICARE

## 2023-09-03 DIAGNOSIS — Z951 Presence of aortocoronary bypass graft: Secondary | ICD-10-CM

## 2023-09-03 DIAGNOSIS — Z952 Presence of prosthetic heart valve: Secondary | ICD-10-CM

## 2023-09-03 NOTE — Telephone Encounter
Today, the patient is calling in c/o popping in midsternal chest.  He said he's been doing the bicycle at cardiac rehab, however, she's had him put his arms on the wall and and raise his arms above his head.  Since then, he's had intermittent pain and popping in his chest.  If he sneezes and doesn't have the heart pillow in place at the time, he's in pain 9/10.  He denies shortness of breath, drainage from the incision or fever.  He said it didn't have it at his appointment with Dr. Helen Hashimoto on 12/3.  Shanda Bumps, NP states Can we get a non contrast CT scan and get him into clinic with Dr. Helen Hashimoto. Until then, he should maintain sternal precautions and try to avoid any movements that cause the clicking/ popping if possible.  Pt scheduled with Dr. Helen Hashimoto  12/24

## 2023-09-10 ENCOUNTER — Ambulatory Visit: Admit: 2023-09-10 | Discharge: 2023-09-10 | Payer: MEDICARE

## 2023-09-10 ENCOUNTER — Encounter: Admit: 2023-09-10 | Discharge: 2023-09-10 | Payer: MEDICARE

## 2023-09-10 DIAGNOSIS — Z952 Presence of prosthetic heart valve: Secondary | ICD-10-CM

## 2023-09-10 DIAGNOSIS — Z951 Presence of aortocoronary bypass graft: Secondary | ICD-10-CM

## 2023-09-10 NOTE — Progress Notes
Date of Service: 09/10/2023       Subjective:             Darius Cherry is a 72 y.o. male.      History of Present Illness  Darius Cherry is being seen in clinic today for recent popping/ clicking in his chest that began last week following cardiac rehabilitation. He was last seen in clinic on 08/20/23 for his routine postoperative visit. He called our office on Tuesday, 09/03/23, reporting that after he had been using the bicycle and had raised his arms above his head that he noticed new popping intermittently in his chest. He was asked to maintain sternal precautions and see [place] today with a CT chest prior to his visit.     Darius Cherry has a past medical history of coronary artery disease with PCI to the circumflex in 2007 and to the circumflex and LAD in 2013, paroxysmal A-fib on apixaban, hypertension, hyperlipidemia, type 2 diabetes and aortic insufficiency who underwent an aortic valve replacement, CABG x3 (LIMA-> LAD, SVG-> OM2, SVG-> left posterior lateral), and left atrial appendage ligation on 07/22/23 under the direction of Dr. Sindy Messing III. He tolerated this procedure well. Postoperatively he initially required vasopressors for hemodynamic stability. The patient had several episodes of atrial fibrillation and EP was consulted. A TEE and cardioversion was scheduled but the patient converted to sinus rhythm prior. Coumadin was initially started for his anticoagulation but later switched back to his home Eliquis. He was transferred to telemetry and his activity improved daily. He was discharged home on POD 7 with his home coreg held due to low blood pressure and a prescription for a week of diuretics.      His CT chest today shows:   1.  Incompletely healed median sternotomy, likely within normal   postsurgical limits. Intact sternotomy wires. No drainable fluid   collection.   2. Interval CABG with marked atherosclerotic calcifications of the native   coronary arteries.   3.  Small left pleural effusion with hazy bilateral groundglass opacities   possibly from expiratory phase imaging or a small component of pulmonary   edema.    4.  There are a few scattered less than 6 mm pulmonary nodules, not all   clearly seen on the prior CT. Attention on 6 month follow-up CT to   reassess.            Review of Systems   Constitutional: Negative.   HENT: Negative.     Eyes: Negative.    Cardiovascular: Negative.    Respiratory: Negative.     Endocrine: Negative.    Hematologic/Lymphatic: Negative.    Skin: Negative.    Musculoskeletal: Negative.         Patient reports popping in chest region when at physical therapy   Gastrointestinal: Negative.    Genitourinary: Negative.    Neurological: Negative.    Psychiatric/Behavioral: Negative.     Allergic/Immunologic: Negative.        Objective:          acetaminophen (TYLENOL) 325 mg tablet Take two tablets by mouth every 4 hours as needed.    amoxicillin(+) (AMOXIL) 500 mg tablet 2g 1hr prior to procedure; 1gm 6hr after procedure    apixaban (ELIQUIS) 5 mg tablet Take one tablet by mouth twice daily.    aspirin 81 mg chewable tablet Chew one tablet by mouth daily. Indications: treatment to prevent a heart attack    atorvastatin (LIPITOR) 80  mg tablet Take one tablet by mouth daily. Indications: hardening of the arteries due to plaque buildup    calcium carbonate/vitamin D-3 (OSCAL-500+D) 1250 mg/200 unit tablet Take one tablet by mouth twice daily. Calcium Carb 1250mg  delivers 500mg  elemental Ca    empagliflozin (JARDIANCE) 25 mg tablet Take one tablet by mouth every morning.    metFORMIN-XR (GLUCOPHAGE XR) 500 mg extended release tablet Take one tablet by mouth twice daily.    oxyCODONE-acetaminophen (PERCOCET) 5-325 mg tablet Take one tablet to two tablets by mouth every 6 hours as needed for Pain. Indications: pain (Patient not taking: Reported on 09/10/2023)    polyethylene glycol 3350 (MIRALAX) 17 g packet Take one packet by mouth twice daily. While taking narcotic pain medication  Indications: constipation    RYBELSUS 14 mg tablet Take one tablet by mouth daily.    sennosides-docusate sodium (SENOKOT-S) 8.6/50 mg tablet Take two tablets by mouth twice daily. While taking narcotic pain medication  Indications: constipation    sotaloL (BETAPACE) 160 mg tablet Take one tablet by mouth every 12 hours. Indications: prevention of recurrent atrial fibrillation    tamsulosin (FLOMAX) 0.4 mg capsule Take one capsule by mouth at bedtime daily.     Vitals:    09/10/23 0902   BP: 118/70   BP Source: Arm, Right Upper   Pulse: 75   SpO2: 97%   O2 Device: None (Room air)   PainSc: Five   Weight: 89.4 kg (197 lb)   Height: 180.3 cm (5' 11)     Body mass index is 27.48 kg/m?Marland Kitchen     Physical Exam  Constitutional:       Appearance: Normal appearance. He is normal weight.   Cardiovascular:      Rate and Rhythm: Normal rate and regular rhythm.   Pulmonary:      Effort: Pulmonary effort is normal.      Breath sounds: Normal breath sounds.   Abdominal:      Palpations: Abdomen is soft.   Musculoskeletal:         General: Normal range of motion.      Comments: Sternal incisions if well healed and well approximated without exudate, erythema, or swelling. Sternum is stable with cough.   Skin:     General: Skin is warm and dry.   Neurological:      Mental Status: He is alert and oriented to person, place, and time.   Psychiatric:         Mood and Affect: Mood normal.         Behavior: Behavior normal.         Thought Content: Thought content normal.         Judgment: Judgment normal.           He will continue his lifting restrictions for a couple more weeks and see if the popping improves. No intervention at this time. He can continue cardiac rehab.     Jarome Lamas, APRN-NP  Thoracic & Cardiovascular Surgery  [phone]    09/10/2023            Assessment and Plan:  I had the pleasure of seeing Darius Cherry for evaluation of some popping sensation in his chest when he started cardiac rehab. He had coronary bypass surgery x 3 on 07/22/2023 and we saw him back in the clinic on 08/20/2023.  Overall he has done extremely well at all stages of his recovery.    When he started cardiac rehab they  did some range of motion exercises with the shoulders and he felt some popping on the sides of his sternum.  He did not experience significant pain.  There was no change in his incision and no fevers.    On exam today his incision is clean and dry and his sternum is stable with cough his lung fields are clear.  We did perform a CTA that is noncontrasted to evaluate his sternum.  The wires are intact and in good position.  There does appear to be the appropriate level of sternal healing for this stage of his recovery.    This is almost assuredly costochondral issues.  I do not believe this warrants intervention just reassurance at this point.  I do believe he can continue to progress with his cardiac rehab.  We have asked him to keep his weight restriction intact for his upper extremity.    He is a very nice gentleman who is very attentive to his recovery program.  I am happy to see him at any point if problems arise.    Sindy Messing, M.D.

## 2023-09-13 ENCOUNTER — Encounter: Admit: 2023-09-13 | Discharge: 2023-09-13 | Payer: MEDICARE

## 2023-09-14 ENCOUNTER — Encounter: Admit: 2023-09-14 | Discharge: 2023-09-14 | Payer: MEDICARE

## 2023-09-17 ENCOUNTER — Encounter: Admit: 2023-09-17 | Discharge: 2023-09-17 | Payer: MEDICARE

## 2023-10-04 ENCOUNTER — Encounter: Admit: 2023-10-04 | Discharge: 2023-10-04 | Payer: MEDICARE

## 2023-10-11 ENCOUNTER — Encounter: Admit: 2023-10-11 | Discharge: 2023-10-11 | Payer: MEDICARE

## 2023-10-11 ENCOUNTER — Ambulatory Visit: Admit: 2023-10-11 | Discharge: 2023-10-12 | Payer: MEDICARE

## 2023-10-11 DIAGNOSIS — I480 Paroxysmal atrial fibrillation: Secondary | ICD-10-CM

## 2023-10-11 DIAGNOSIS — I10 Essential (primary) hypertension: Secondary | ICD-10-CM

## 2023-10-11 DIAGNOSIS — I2510 Atherosclerotic heart disease of native coronary artery without angina pectoris: Secondary | ICD-10-CM

## 2023-10-11 DIAGNOSIS — E782 Mixed hyperlipidemia: Secondary | ICD-10-CM

## 2023-10-11 DIAGNOSIS — Z136 Encounter for screening for cardiovascular disorders: Secondary | ICD-10-CM

## 2023-10-11 DIAGNOSIS — Z9889 Other specified postprocedural states: Secondary | ICD-10-CM

## 2023-10-11 DIAGNOSIS — Z952 Presence of prosthetic heart valve: Secondary | ICD-10-CM

## 2023-10-11 DIAGNOSIS — E119 Type 2 diabetes mellitus without complications: Secondary | ICD-10-CM

## 2023-10-11 MED ORDER — AMOXICILLIN 500 MG PO TAB
ORAL_TABLET | ORAL | 1 refills | 7.00000 days | Status: AC
Start: 2023-10-11 — End: ?

## 2023-10-11 NOTE — Patient Instructions
Be sure to take 2 Grams amoxicillin 30 minutes before dental procedures.

## 2023-10-11 NOTE — Assessment & Plan Note
Metformin 500 BID and Jardiance 25/d.  Rybelsus 14 mg/day.    Hgb A1c was 11.2% in November, 2024.  Jardiance and Rybelsus were added in November, 2024.

## 2023-10-11 NOTE — Progress Notes
Date of Service: 10/11/2023    Darius Cherry is a 73 y.o. male.       HPI     Darius Cherry was in the [facility] clinic today for follow-up regarding coronary disease, aortic stenosis, and paroxysmal atrial fibrillation.  [name] underwent CABG/AVR in November and he's really had an amazing recovery.  He had very minimal sternal discomfort.  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    His atrial fibrillation was diagnosed back in 2023 and was an incidental finding during a routine office visit.  He had absolutely no symptoms.  He underwent cardioversion in April, 2023, and we started him on sotalol.  He has been on Eliquis all along and has not had any bleeding complications.  He did have some post-op AF, but converted back to SR spontaneously.  He's back on the baseline Eliquis and sotalol dosing.  His AF has never been symptomatic.      He's a very active guy who is pretty stoic by nature.  He says that the exertional dyspnea he was having back in the summer is gone.  His stamina is definitely better now also.       Vitals:    10/11/23 1032   BP: 126/86   BP Source: Arm, Left Upper   Pulse: 78   SpO2: 97%   O2 Device: None (Room air)   PainSc: Zero   Weight: 90 kg (198 lb 6.4 oz)   Height: 180.3 cm (5' 11)     Body mass index is 27.67 kg/m?Marland Kitchen     Past Medical History  Patient Active Problem List    Diagnosis Date Noted    Hypokalemia 07/28/2023    S/P left atrial appendage ligation 07/27/2023     07/22/23 - S/p CABG, Atriclip, AVR(t) - [name]      Pleural effusion, bilateral 07/27/2023    Hypocalcemia 07/27/2023    Alcohol abuse with withdrawal (HCC) 07/26/2023    Anticoagulated on warfarin 07/24/2023     For AVR ppx and pAF; INR goal 2-3      S/P AVR (aortic valve replacement) 07/22/2023     07/22/23 - S/p CABG, Atriclip, AVR(t) - [name]      Acute blood loss anemia 07/22/2023    Paroxysmal atrial fibrillation (HCC) 01/18/2022     12/18/2021 - Incidental finding of AF during routine physical.  Eliquis started.      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 Ludwig Lean: Successful percutaneous coronary intervention of the circumflex obtuse marginal bifurcation with a 3.0 x 23 mm Vision bare metal stent extending from the circumflex into the obtuse marginal and a 3.5 x 12 mm Vision bare metal stent in the circumflex at the bifurcation utilizing a Kulot stent technique.     05/20/12: Drug-eluting stent PCI to prox CFX and LAD by Dr. Bufford Buttner    07/22/23 - S/p CABG, Atriclip, AVR(t) - [name]  Review of Systems   Constitutional: Negative.   HENT: Negative.     Eyes: Negative.    Cardiovascular: Negative.    Respiratory: Negative.     Endocrine: Negative.    Hematologic/Lymphatic: Negative.    Skin: Negative.    Musculoskeletal: Negative.    Gastrointestinal: Negative.    Genitourinary: Negative.    Neurological: Negative.    Psychiatric/Behavioral: Negative.     Allergic/Immunologic: Negative.        Physical Exam    Physical Exam   General Appearance: no distress   Skin: warm, no ulcers or xanthomas   Digits and Nails: no cyanosis or clubbing   Eyes: conjunctivae and lids normal, pupils are equal and round   Teeth/Gums/Palate: dentition unremarkable, no lesions   Lips & Oral Mucosa: no pallor or cyanosis   Neck Veins: normal JVP , neck veins are not distended   Thyroid: no nodules, masses, tenderness or enlargement   Chest Inspection: chest is normal in appearance   Respiratory Effort: breathing comfortably, no respiratory distress Auscultation/Percussion: lungs clear to auscultation, no rales or rhonchi, no wheezing   PMI: PMI not enlarged or displaced   Cardiac Rhythm: regular rhythm and normal rate   Cardiac Auscultation: S1, S2 normal, no rub, no gallop   Murmurs: grade 1 systolic murmur   Peripheral Circulation: normal peripheral circulation   Carotid Arteries: normal carotid upstroke bilaterally, no bruits   Radial Arteries: normal symmetric radial pulses   Abdominal Aorta: no abdominal aortic bruit   Pedal Pulses: normal symmetric pedal pulses   Lower Extremity Edema: no lower extremity edema   Abdominal Exam: soft, non-tender, no masses, bowel sounds normal   Liver & Spleen: no organomegaly   Gait & Station: walks without assistance   Muscle Strength: normal muscle tone   Orientation: oriented to time, place and person   Affect & Mood: appropriate and sustained affect   Language and Memory: patient responsive and seems to comprehend information   Neurologic Exam: neurological assessment grossly intact   Other: moves all extremities      Cardiovascular Studies    EKG:  SR, rate 64.  IRBBB.  QTc 464 msec.    Cardiovascular Health Factors  Vitals BP Readings from Last 3 Encounters:   10/11/23 126/86   09/10/23 118/70   08/20/23 126/64     Wt Readings from Last 3 Encounters:   10/11/23 90 kg (198 lb 6.4 oz)   09/10/23 89.4 kg (197 lb)   08/20/23 88.5 kg (195 lb)     BMI Readings from Last 3 Encounters:   10/11/23 27.67 kg/m?   09/10/23 27.48 kg/m?   08/20/23 27.20 kg/m?      Smoking Social History     Tobacco Use   Smoking Status Former    Current packs/day: 0.00    Average packs/day: 2.0 packs/day for 5.0 years (10.0 ttl pk-yrs)    Types: Cigarettes    Start date: 02/15/2001    Quit date: 02/15/2006    Years since quitting: 17.6   Smokeless Tobacco Never      Lipid Profile Cholesterol   Date Value Ref Range Status   08/07/2023 106  Final     HDL   Date Value Ref Range Status   08/07/2023 33 (L) >=40 Final     LDL   Date Value Ref Range Status   08/07/2023 59  Final     Triglycerides   Date Value Ref Range Status   08/07/2023 74  Final  Blood Sugar Hemoglobin A1C   Date Value Ref Range Status   08/07/2023 6.7  Final     Glucose   Date Value Ref Range Status   08/07/2023 147 (H) 70 - 105 Final   07/29/2023 143 (H) 70 - 100 mg/dL Final   16/06/9603 540 (H) 70 - 100 mg/dL Final     Glucose, POC   Date Value Ref Range Status   07/29/2023 198 (H) 70 - 100 mg/dL Final   98/07/9146 829 (H) 70 - 100 mg/dL Final   56/21/3086 578 (H) 70 - 100 mg/dL Final          Problems Addressed Today  Encounter Diagnoses   Name Primary?    Coronary artery disease involving native coronary artery of native heart without angina pectoris Yes    Paroxysmal atrial fibrillation (HCC)     Essential hypertension     Mixed hyperlipidemia     S/P AVR (aortic valve replacement)     S/P left atrial appendage ligation     Type 2 diabetes mellitus without complication, without long-term current use of insulin (HCC)     Screening for heart disease        Assessment and Plan       Coronary artery disease involving native coronary artery of native heart  3-V CAD by pre-AVR arteriography.  CABG with AVR November, 2024.    Paroxysmal atrial fibrillation (HCC)  Incidental finding of AF in April, 2023.  Started on Eliquis and sotalol s/p cardioversion.  LAA ligation with AVR/CABG in November, 2024.    Essential hypertension  Tamsulosin 0.4 mg/d, otherwise no BP-lowering medications.  He wasn't on any anti-hypertensives prior to his AVR/CABG.    Hyperlipemia  Lab Results   Component Value Date    CHOL 106 08/07/2023    TRIG 74 08/07/2023    HDL 33 (L) 08/07/2023    LDL 59 08/07/2023    VLDL 15 08/07/2023    NONHDLCHOL 83 06/10/2023    CHOLHDLC 3 08/07/2023      Atorva 80/d, increased from 40/d prior to CABG & AVR in November.  Updated lipid profile could be done some time next month.    S/P AVR (aortic valve replacement)  Bio-prosthetic AVR November, 2024 ([name]) for severe aortic stenosis.  Endocarditis prophylaxis already due to prosthetic ankle.  He does see his dentist twice yearly.    S/P left atrial appendage ligation  LAA ligation with CABG/AVR in November, 2024.    Type 2 diabetes mellitus without complication, without long-term current use of insulin (HCC)  Metformin 500 BID and Jardiance 25/d.  Rybelsus 14 mg/day.    Hgb A1c was 11.2% in November, 2024.  Jardiance and Rybelsus were added in November, 2024.    Current Medications (including today's revisions)   acetaminophen (TYLENOL) 325 mg tablet Take two tablets by mouth every 4 hours as needed.    amoxicillin (AMOXIL) 500 mg tablet 2g 1hr prior to procedure; 1gm 6hr after procedure    apixaban (ELIQUIS) 5 mg tablet Take one tablet by mouth twice daily.    aspirin 81 mg chewable tablet Chew one tablet by mouth daily. Indications: treatment to prevent a heart attack    atorvastatin (LIPITOR) 80 mg tablet Take one tablet by mouth daily. Indications: hardening of the arteries due to plaque buildup    calcium carbonate/vitamin D-3 (OSCAL-500+D) 1250 mg/200 unit tablet Take one tablet by mouth twice daily. Calcium Carb 1250mg  delivers 500mg  elemental Ca  empagliflozin (JARDIANCE) 25 mg tablet Take one tablet by mouth every morning.    metFORMIN-XR (GLUCOPHAGE XR) 500 mg extended release tablet Take one tablet by mouth twice daily.    polyethylene glycol 3350 (MIRALAX) 17 g packet Take one packet by mouth twice daily. While taking narcotic pain medication  Indications: constipation    RYBELSUS 14 mg tablet Take one tablet by mouth daily.    sennosides-docusate sodium (SENOKOT-S) 8.6/50 mg tablet Take two tablets by mouth twice daily. While taking narcotic pain medication  Indications: constipation    sotaloL (BETAPACE) 160 mg tablet Take one tablet by mouth every 12 hours. Indications: prevention of recurrent atrial fibrillation    tamsulosin (FLOMAX) 0.4 mg capsule Take one capsule by mouth at bedtime daily.     Total time spent on today's office visit was 45 minutes.  This includes face-to-face in person visit with patient as well as nonface-to-face time including review of the EMR, outside records, labs, radiologic studies, echocardiogram & other cardiovascular studies, formation of treatment plan, after visit summary, future disposition, and lastly on documentation.

## 2023-10-11 NOTE — Assessment & Plan Note
Tamsulosin 0.4 mg/d, otherwise no BP-lowering medications.  He wasn't on any anti-hypertensives prior to his AVR/CABG.

## 2023-10-11 NOTE — Assessment & Plan Note
3-V CAD by pre-AVR arteriography.  CABG with AVR November, 2024.

## 2023-10-11 NOTE — Assessment & Plan Note
Incidental finding of AF in April, 2023.  Started on Eliquis and sotalol s/p cardioversion.  LAA ligation with AVR/CABG in November, 2024.

## 2023-10-11 NOTE — Assessment & Plan Note
LAA ligation with CABG/AVR in November, 2024.

## 2023-10-11 NOTE — Assessment & Plan Note
Lab Results   Component Value Date    CHOL 106 08/07/2023    TRIG 74 08/07/2023    HDL 33 (L) 08/07/2023    LDL 59 08/07/2023    VLDL 15 08/07/2023    NONHDLCHOL 83 06/10/2023    CHOLHDLC 3 08/07/2023      Atorva 80/d, increased from 40/d prior to CABG & AVR in November.  Updated lipid profile could be done some time next month.

## 2023-10-24 ENCOUNTER — Encounter: Admit: 2023-10-24 | Discharge: 2023-10-24 | Payer: MEDICARE

## 2024-04-08 ENCOUNTER — Encounter: Admit: 2024-04-08 | Discharge: 2024-04-08 | Payer: MEDICARE

## 2024-04-09 ENCOUNTER — Encounter: Admit: 2024-04-09 | Discharge: 2024-04-09 | Payer: MEDICARE

## 2024-04-09 ENCOUNTER — Ambulatory Visit: Admit: 2024-04-09 | Discharge: 2024-04-09 | Payer: MEDICARE

## 2024-04-09 DIAGNOSIS — I10 Essential (primary) hypertension: Secondary | ICD-10-CM

## 2024-04-09 DIAGNOSIS — I480 Paroxysmal atrial fibrillation: Secondary | ICD-10-CM

## 2024-04-09 DIAGNOSIS — Z952 Presence of prosthetic heart valve: Principal | ICD-10-CM

## 2024-04-09 DIAGNOSIS — Z136 Encounter for screening for cardiovascular disorders: Secondary | ICD-10-CM

## 2024-04-09 DIAGNOSIS — Z9889 Other specified postprocedural states: Secondary | ICD-10-CM

## 2024-04-09 DIAGNOSIS — I2510 Atherosclerotic heart disease of native coronary artery without angina pectoris: Secondary | ICD-10-CM

## 2024-04-09 DIAGNOSIS — E782 Mixed hyperlipidemia: Secondary | ICD-10-CM

## 2024-04-09 NOTE — Assessment & Plan Note
 Tamsulosin 0.4 mg/d, otherwise no BP-lowering medications.  He wasn't on any anti-hypertensives prior to his AVR/CABG.

## 2024-04-09 NOTE — Assessment & Plan Note
 LAA ligation with CABG/AVR in November, 2024.

## 2024-04-09 NOTE — Assessment & Plan Note
 3-V CAD by pre-AVR arteriography.  CABG with AVR November, 2024.

## 2024-04-09 NOTE — Progress Notes
 Date of Service: 04/09/2024    Darius Cherry is a 73 y.o. male.       HPI     Darius Cherry was in the [facility] today for follow-up regarding coronary disease, aortic stenosis, and paroxysmal atrial fibrillation.  Darius Cherry underwent CABG/AVR in November and he's really had an amazing recovery.  He had very minimal sternal discomfort.  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     His atrial fibrillation was diagnosed back in 2023 and was an incidental finding during a routine office visit.  He had absolutely no symptoms.  He underwent cardioversion in April, 2023, and we started him on sotalol .  He has been on Eliquis all along and has not had any bleeding complications.  He did have some post-op AF, but converted back to SR spontaneously.  He's back on the baseline Eliquis and sotalol  dosing.  His AF has never been symptomatic.      He's a very active guy who is pretty stoic by nature.  He says that the exertional dyspnea he was having back in the summer is gone.  His stamina is definitely better now also.            Vitals:    04/09/24 0926 04/09/24 0946   BP: (!) 147/89 120/80   BP Source: Arm, Left Upper    Pulse: 60    SpO2: 96%    O2 Device: None (Room air)    PainSc: Zero    Weight: 89.4 kg (197 lb)    Height: 175.3 cm (5' 9)      Body mass index is 29.09 kg/m?SABRA     Past Medical History  Patient Active Problem List    Diagnosis Date Noted    Hypokalemia 07/28/2023    S/P left atrial appendage ligation 07/27/2023     07/22/23 - S/p CABG, Atriclip, AVR(t) - [name]      Pleural effusion, bilateral 07/27/2023    Hypocalcemia 07/27/2023    Alcohol abuse with withdrawal (CMS-HCC) 07/26/2023    Anticoagulated on warfarin 07/24/2023     For AVR ppx and pAF; INR goal 2-3      S/P AVR (aortic valve replacement) 07/22/2023     07/22/23 - S/p CABG, Atriclip, AVR(t) - [name]      Acute blood loss anemia 07/22/2023    Paroxysmal atrial fibrillation (CMS-HCC) 01/18/2022     12/18/2021 - Incidental finding of AF during routine physical.  Eliquis started.      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 Zorita: Successful percutaneous coronary intervention of the circumflex obtuse marginal bifurcation with a 3.0 x 23 mm Vision bare metal stent extending from the circumflex into the obtuse marginal and a 3.5 x 12 mm Vision bare metal stent in the circumflex at the bifurcation utilizing a Kulot stent technique.     05/20/12: Drug-eluting stent PCI to prox CFX and LAD by Dr. Albina  07/22/23 - S/p CABG, Atriclip, AVR(t) - [name]           Review of Systems   Constitutional: Negative.   HENT: Negative.     Eyes: Negative.    Cardiovascular: Negative.    Respiratory: Negative.     Endocrine: Negative.    Hematologic/Lymphatic: Negative.    Skin: Negative.    Musculoskeletal: Negative.    Gastrointestinal: Negative.    Genitourinary: Negative.    Neurological: Negative.    Psychiatric/Behavioral: Negative.     Allergic/Immunologic: Negative.        Physical Exam    Physical Exam   General Appearance: no distress   Skin: warm, no ulcers or xanthomas   Digits and Nails: no cyanosis or clubbing   Eyes: conjunctivae and lids normal, pupils are equal and round   Teeth/Gums/Palate: dentition unremarkable, no lesions   Lips & Oral Mucosa: no pallor or cyanosis   Neck Veins: normal JVP , neck veins are not distended   Thyroid: no nodules, masses, tenderness or enlargement   Chest Inspection: chest is normal in appearance   Respiratory Effort: breathing comfortably, no respiratory distress   Auscultation/Percussion: lungs clear to auscultation, no rales or rhonchi, no wheezing   PMI: PMI not enlarged or displaced   Cardiac Rhythm: regular rhythm and normal rate   Cardiac Auscultation: S1, S2 normal, no rub, no gallop   Murmurs: grade 2 systolic murmur   Peripheral Circulation: normal peripheral circulation   Carotid Arteries: normal carotid upstroke bilaterally, no bruits   Radial Arteries: normal symmetric radial pulses   Abdominal Aorta: no abdominal aortic bruit   Pedal Pulses: normal symmetric pedal pulses   Lower Extremity Edema: no lower extremity edema   Abdominal Exam: soft, non-tender, no masses, bowel sounds normal   Liver & Spleen: no organomegaly   Gait & Station: walks without assistance   Muscle Strength: normal muscle tone   Orientation: oriented to time, place and person   Affect & Mood: appropriate and sustained affect   Language and Memory: patient responsive and seems to comprehend information   Neurologic Exam: neurological assessment grossly intact   Other: moves all extremities      Cardiovascular Studies    EKG:  Sinus brady, 55.  QTc 455 msec    Cardiovascular Health Factors  Vitals BP Readings from Last 3 Encounters:   04/09/24 120/80   10/11/23 126/86   09/10/23 118/70     Wt Readings from Last 3 Encounters:   04/09/24 89.4 kg (197 lb)   10/11/23 90 kg (198 lb 6.4 oz)   09/10/23 89.4 kg (197 lb)     BMI Readings from Last 3 Encounters:   04/09/24 29.09 kg/m?   10/11/23 27.67 kg/m?   09/10/23 27.48 kg/m?      Smoking Social History     Tobacco Use   Smoking Status Former    Current packs/day: 0.00    Average packs/day: 2.0 packs/day for 5.0 years (10.0 ttl pk-yrs)    Types: Cigarettes    Start date: 02/15/2001    Quit date: 02/15/2006    Years since quitting: 18.1   Smokeless Tobacco Never      Lipid Profile Cholesterol   Date Value Ref Range Status   08/07/2023 106  Final     HDL   Date Value Ref Range Status   08/07/2023 33 (L) >=40 Final     LDL   Date Value Ref Range Status   08/07/2023 59  Final     Triglycerides   Date Value Ref Range Status   08/07/2023 74  Final      Blood Sugar Hemoglobin A1C   Date Value Ref Range Status   11/11/2023 8.3 (H) 4.5 - 6.5 Final     Glucose   Date Value Ref Range Status   08/07/2023 147 (H) 70 - 105 Final   07/29/2023 143 (H) 70 - 100 mg/dL Final   88/89/7975 856 (H) 70 - 100 mg/dL Final     Glucose, POC   Date Value Ref Range Status   07/29/2023 198 (H) 70 - 100 mg/dL Final   88/88/7975 840 (H) 70 - 100 mg/dL Final   88/88/7975 849 (H) 70 - 100 mg/dL Final          Problems Addressed Today  Encounter Diagnoses   Name Primary?    S/P AVR (aortic valve replacement) Yes    S/P left atrial appendage ligation     Coronary artery disease involving native coronary artery of native heart without angina pectoris     Mixed hyperlipidemia     Essential hypertension     Screening for heart disease     Paroxysmal atrial fibrillation (CMS-HCC)        Assessment and Plan       S/P AVR (aortic valve replacement)  Bio-prosthetic SAVR November, 2024 ([name]) for severe aortic stenosis. Endocarditis prophylaxis already due to prosthetic ankle. He does see his dentist twice yearly.     He does need to see his dentist every 6 months.  He has a Rx for amoxicillin .    S/P left atrial appendage ligation  LAA ligation with CABG/AVR in November, 2024.    Coronary artery disease involving native coronary artery of native heart  3-V CAD by pre-AVR arteriography. CABG with AVR November, 2024     Hyperlipemia  Lab Results   Component Value Date    CHOL 106 08/07/2023    TRIG 74 08/07/2023    HDL 33 (L) 08/07/2023    LDL 59 08/07/2023    VLDL 15 08/07/2023    NONHDLCHOL 83 06/10/2023    CHOLHDLC 3 08/07/2023      Atorva 80/d    Essential hypertension  Tamsulosin  0.4 mg/d, otherwise no BP-lowering medications.  He wasn't on any anti-hypertensives prior to his AVR/CABG.       Paroxysmal atrial fibrillation (CMS-HCC)  This was an incidental finding during an OV in 12/2021.  Underwent cardioversion and is still on sotalol  and Eliquis.  LAA was ligated during AVR/CABG in November, 2024.    EKG today shows normal QTc on sotalol .      Current Medications (including today's revisions)   acetaminophen  (TYLENOL ) 325 mg tablet Take two tablets by mouth every 4 hours as needed.    amoxicillin  (AMOXIL ) 500 mg tablet 2g 1hr prior to procedure; 1gm 6hr after procedure    apixaban (ELIQUIS) 5 mg tablet Take one tablet by mouth twice daily.    aspirin  81 mg chewable tablet Chew one tablet by mouth daily. Indications: treatment to prevent a heart attack    atorvastatin  (LIPITOR) 80 mg tablet Take one tablet by mouth daily. Indications: hardening of the arteries due to plaque buildup    calcium carbonate/vitamin D-3 (OSCAL-500+D) 1250 mg/200 unit tablet Take one tablet by mouth twice daily. Calcium Carb 1250mg  delivers 500mg  elemental Ca    empagliflozin (JARDIANCE) 25 mg tablet Take one tablet by mouth every morning.    metFORMIN -XR (GLUCOPHAGE   XR) 500 mg extended release tablet Take one tablet by mouth twice daily.    polyethylene glycol 3350  (MIRALAX ) 17 g packet Take one packet by mouth twice daily. While taking narcotic pain medication  Indications: constipation (Patient not taking: Reported on 04/09/2024)    RYBELSUS 14 mg tablet Take one tablet by mouth daily.    sennosides-docusate sodium  (SENOKOT-S) 8.6/50 mg tablet Take two tablets by mouth twice daily. While taking narcotic pain medication  Indications: constipation (Patient not taking: Reported on 04/09/2024)    sotaloL  (BETAPACE ) 160 mg tablet Take one tablet by mouth every 12 hours. Indications: prevention of recurrent atrial fibrillation    tamsulosin  (FLOMAX ) 0.4 mg capsule Take one capsule by mouth at bedtime daily.     Total time spent on today's office visit was 45 minutes.  This includes face-to-face in person visit with patient as well as nonface-to-face time including review of the EMR, outside records, labs, radiologic studies, echocardiogram & other cardiovascular studies, formation of treatment plan, after visit summary, future disposition, and lastly on documentation.

## 2024-04-09 NOTE — Assessment & Plan Note
 Lab Results   Component Value Date    CHOL 106 08/07/2023    TRIG 74 08/07/2023    HDL 33 (L) 08/07/2023    LDL 59 08/07/2023    VLDL 15 08/07/2023    NONHDLCHOL 83 06/10/2023    CHOLHDLC 3 08/07/2023      Atorva 80/d

## 2024-04-09 NOTE — Assessment & Plan Note
 This was an incidental finding during an OV in 12/2021.  Underwent cardioversion and is still on sotalol  and Eliquis.  LAA was ligated during AVR/CABG in November, 2024.    EKG today shows normal QTc on sotalol .

## 2024-04-28 ENCOUNTER — Encounter: Admit: 2024-04-28 | Discharge: 2024-04-28 | Payer: MEDICARE

## 2024-04-28 NOTE — Progress Notes
 CT chest dated 09/10/2023 sent to PCP on file. Report recommends a follow up chest CT in 6 months.

## 2024-05-19 ENCOUNTER — Encounter: Admit: 2024-05-19 | Discharge: 2024-05-19 | Payer: MEDICARE

## 2024-06-02 ENCOUNTER — Encounter: Admit: 2024-06-02 | Discharge: 2024-06-02 | Payer: MEDICARE

## 2024-06-02 MED ORDER — ATORVASTATIN 80 MG PO TAB
ORAL_TABLET | ORAL | 3 refills | 90.00000 days | Status: AC
Start: 2024-06-02 — End: ?

## 2024-08-18 ENCOUNTER — Encounter: Admit: 2024-08-18 | Discharge: 2024-08-18 | Payer: MEDICARE
